# Patient Record
Sex: Female | Born: 1950
Health system: Southern US, Community
[De-identification: ages and names within clinical notes are randomized; demographics above are authoritative.]

## PROBLEM LIST (undated history)

## (undated) DIAGNOSIS — R918 Other nonspecific abnormal finding of lung field: Secondary | ICD-10-CM

## (undated) DIAGNOSIS — I10 Essential (primary) hypertension: Secondary | ICD-10-CM

## (undated) DIAGNOSIS — T7840XA Allergy, unspecified, initial encounter: Secondary | ICD-10-CM

## (undated) DIAGNOSIS — D649 Anemia, unspecified: Secondary | ICD-10-CM

## (undated) DIAGNOSIS — M1990 Unspecified osteoarthritis, unspecified site: Secondary | ICD-10-CM

## (undated) HISTORY — DX: Allergy, unspecified, initial encounter: T78.40XA

## (undated) HISTORY — PX: KNEE ARTHROSCOPY: SHX127

## (undated) HISTORY — DX: Essential (primary) hypertension: I10

## (undated) HISTORY — DX: Other nonspecific abnormal finding of lung field: R91.8

## (undated) HISTORY — DX: Unspecified osteoarthritis, unspecified site: M19.90

## (undated) HISTORY — PX: ROTATOR CUFF REPAIR: SHX139

## (undated) HISTORY — PX: BREAST BIOPSY: SHX20

## (undated) HISTORY — DX: Anemia, unspecified: D64.9

## (undated) HISTORY — PX: VAGINAL HYSTERECTOMY: SUR661

---

## 1998-02-10 ENCOUNTER — Emergency Department (HOSPITAL_COMMUNITY): Admission: EM | Admit: 1998-02-10 | Discharge: 1998-02-10 | Payer: Self-pay | Admitting: Emergency Medicine

## 1998-08-21 ENCOUNTER — Encounter: Payer: Self-pay | Admitting: Internal Medicine

## 1998-08-21 ENCOUNTER — Ambulatory Visit (HOSPITAL_COMMUNITY): Admission: RE | Admit: 1998-08-21 | Discharge: 1998-08-21 | Payer: Self-pay | Admitting: Internal Medicine

## 1998-10-16 ENCOUNTER — Ambulatory Visit (HOSPITAL_COMMUNITY): Admission: RE | Admit: 1998-10-16 | Discharge: 1998-10-16 | Payer: Self-pay | Admitting: Specialist

## 1998-10-16 ENCOUNTER — Encounter: Payer: Self-pay | Admitting: Specialist

## 1998-10-29 ENCOUNTER — Encounter: Admission: RE | Admit: 1998-10-29 | Discharge: 1998-11-23 | Payer: Self-pay | Admitting: Specialist

## 1999-10-01 ENCOUNTER — Encounter: Payer: Self-pay | Admitting: Obstetrics and Gynecology

## 1999-10-01 ENCOUNTER — Ambulatory Visit (HOSPITAL_COMMUNITY): Admission: RE | Admit: 1999-10-01 | Discharge: 1999-10-01 | Payer: Self-pay | Admitting: Obstetrics and Gynecology

## 1999-10-26 ENCOUNTER — Ambulatory Visit (HOSPITAL_COMMUNITY): Admission: RE | Admit: 1999-10-26 | Discharge: 1999-10-26 | Payer: Self-pay | Admitting: Gastroenterology

## 1999-10-26 ENCOUNTER — Encounter (INDEPENDENT_AMBULATORY_CARE_PROVIDER_SITE_OTHER): Payer: Self-pay

## 1999-11-24 ENCOUNTER — Other Ambulatory Visit: Admission: RE | Admit: 1999-11-24 | Discharge: 1999-11-24 | Payer: Self-pay | Admitting: Obstetrics and Gynecology

## 2000-04-11 ENCOUNTER — Observation Stay (HOSPITAL_COMMUNITY): Admission: RE | Admit: 2000-04-11 | Discharge: 2000-04-12 | Payer: Self-pay | Admitting: Cardiology

## 2000-05-04 ENCOUNTER — Encounter: Payer: Self-pay | Admitting: Gastroenterology

## 2000-05-04 ENCOUNTER — Ambulatory Visit (HOSPITAL_COMMUNITY): Admission: RE | Admit: 2000-05-04 | Discharge: 2000-05-04 | Payer: Self-pay | Admitting: Gastroenterology

## 2000-10-05 ENCOUNTER — Encounter: Payer: Self-pay | Admitting: Obstetrics and Gynecology

## 2000-10-05 ENCOUNTER — Ambulatory Visit (HOSPITAL_COMMUNITY): Admission: RE | Admit: 2000-10-05 | Discharge: 2000-10-05 | Payer: Self-pay | Admitting: Obstetrics and Gynecology

## 2000-12-25 ENCOUNTER — Other Ambulatory Visit: Admission: RE | Admit: 2000-12-25 | Discharge: 2000-12-25 | Payer: Self-pay | Admitting: Obstetrics and Gynecology

## 2001-04-08 ENCOUNTER — Emergency Department (HOSPITAL_COMMUNITY): Admission: EM | Admit: 2001-04-08 | Discharge: 2001-04-08 | Payer: Self-pay | Admitting: Emergency Medicine

## 2001-04-08 ENCOUNTER — Encounter: Payer: Self-pay | Admitting: Emergency Medicine

## 2001-09-04 ENCOUNTER — Encounter: Payer: Self-pay | Admitting: Internal Medicine

## 2001-09-04 ENCOUNTER — Encounter: Admission: RE | Admit: 2001-09-04 | Discharge: 2001-09-04 | Payer: Self-pay | Admitting: Internal Medicine

## 2001-09-20 ENCOUNTER — Encounter: Admission: RE | Admit: 2001-09-20 | Discharge: 2001-09-20 | Payer: Self-pay | Admitting: Internal Medicine

## 2001-09-20 ENCOUNTER — Encounter: Payer: Self-pay | Admitting: Internal Medicine

## 2001-11-05 ENCOUNTER — Ambulatory Visit (HOSPITAL_COMMUNITY): Admission: RE | Admit: 2001-11-05 | Discharge: 2001-11-05 | Payer: Self-pay | Admitting: Internal Medicine

## 2001-11-05 ENCOUNTER — Encounter: Payer: Self-pay | Admitting: Internal Medicine

## 2001-12-31 ENCOUNTER — Other Ambulatory Visit: Admission: RE | Admit: 2001-12-31 | Discharge: 2001-12-31 | Payer: Self-pay | Admitting: Obstetrics and Gynecology

## 2002-11-19 ENCOUNTER — Encounter: Payer: Self-pay | Admitting: Emergency Medicine

## 2002-11-19 ENCOUNTER — Emergency Department (HOSPITAL_COMMUNITY): Admission: EM | Admit: 2002-11-19 | Discharge: 2002-11-19 | Payer: Self-pay | Admitting: Emergency Medicine

## 2002-11-21 ENCOUNTER — Ambulatory Visit (HOSPITAL_COMMUNITY): Admission: RE | Admit: 2002-11-21 | Discharge: 2002-11-21 | Payer: Self-pay | Admitting: Internal Medicine

## 2002-11-21 ENCOUNTER — Encounter: Payer: Self-pay | Admitting: Internal Medicine

## 2003-02-05 ENCOUNTER — Encounter: Admission: RE | Admit: 2003-02-05 | Discharge: 2003-02-19 | Payer: Self-pay | Admitting: Occupational Medicine

## 2003-02-14 ENCOUNTER — Encounter: Payer: Self-pay | Admitting: Emergency Medicine

## 2003-02-14 ENCOUNTER — Emergency Department (HOSPITAL_COMMUNITY): Admission: EM | Admit: 2003-02-14 | Discharge: 2003-02-14 | Payer: Self-pay | Admitting: Emergency Medicine

## 2003-02-23 ENCOUNTER — Encounter: Payer: Self-pay | Admitting: Orthopedic Surgery

## 2003-02-23 ENCOUNTER — Encounter: Admission: RE | Admit: 2003-02-23 | Discharge: 2003-02-23 | Payer: Self-pay | Admitting: Orthopedic Surgery

## 2003-03-10 ENCOUNTER — Ambulatory Visit (HOSPITAL_BASED_OUTPATIENT_CLINIC_OR_DEPARTMENT_OTHER): Admission: RE | Admit: 2003-03-10 | Discharge: 2003-03-10 | Payer: Self-pay | Admitting: Orthopedic Surgery

## 2003-03-25 ENCOUNTER — Encounter: Admission: RE | Admit: 2003-03-25 | Discharge: 2003-05-02 | Payer: Self-pay | Admitting: Orthopedic Surgery

## 2003-05-20 ENCOUNTER — Encounter: Admission: RE | Admit: 2003-05-20 | Discharge: 2003-05-21 | Payer: Self-pay | Admitting: Orthopedic Surgery

## 2004-05-21 ENCOUNTER — Ambulatory Visit (HOSPITAL_COMMUNITY): Admission: RE | Admit: 2004-05-21 | Discharge: 2004-05-21 | Payer: Self-pay | Admitting: *Deleted

## 2004-10-18 ENCOUNTER — Ambulatory Visit (HOSPITAL_COMMUNITY): Admission: RE | Admit: 2004-10-18 | Discharge: 2004-10-18 | Payer: Self-pay | Admitting: Internal Medicine

## 2004-11-03 ENCOUNTER — Emergency Department (HOSPITAL_COMMUNITY): Admission: EM | Admit: 2004-11-03 | Discharge: 2004-11-03 | Payer: Self-pay | Admitting: Emergency Medicine

## 2005-02-24 ENCOUNTER — Encounter: Admission: RE | Admit: 2005-02-24 | Discharge: 2005-02-24 | Payer: Self-pay | Admitting: *Deleted

## 2005-03-10 ENCOUNTER — Encounter: Admission: RE | Admit: 2005-03-10 | Discharge: 2005-03-10 | Payer: Self-pay | Admitting: *Deleted

## 2005-05-13 ENCOUNTER — Observation Stay (HOSPITAL_COMMUNITY): Admission: RE | Admit: 2005-05-13 | Discharge: 2005-05-14 | Payer: Self-pay | Admitting: Surgery

## 2005-05-13 ENCOUNTER — Encounter (INDEPENDENT_AMBULATORY_CARE_PROVIDER_SITE_OTHER): Payer: Self-pay | Admitting: Specialist

## 2005-08-07 ENCOUNTER — Emergency Department (HOSPITAL_COMMUNITY): Admission: EM | Admit: 2005-08-07 | Discharge: 2005-08-07 | Payer: Self-pay | Admitting: Family Medicine

## 2005-08-08 ENCOUNTER — Emergency Department (HOSPITAL_COMMUNITY): Admission: EM | Admit: 2005-08-08 | Discharge: 2005-08-08 | Payer: Self-pay | Admitting: Emergency Medicine

## 2005-09-12 HISTORY — PX: COLONOSCOPY: SHX174

## 2005-12-01 ENCOUNTER — Ambulatory Visit (HOSPITAL_COMMUNITY): Admission: RE | Admit: 2005-12-01 | Discharge: 2005-12-01 | Payer: Self-pay | Admitting: Internal Medicine

## 2007-01-12 ENCOUNTER — Ambulatory Visit (HOSPITAL_COMMUNITY): Admission: RE | Admit: 2007-01-12 | Discharge: 2007-01-12 | Payer: Self-pay | Admitting: Specialist

## 2007-01-19 ENCOUNTER — Ambulatory Visit (HOSPITAL_COMMUNITY): Admission: RE | Admit: 2007-01-19 | Discharge: 2007-01-19 | Payer: Self-pay | Admitting: Specialist

## 2007-05-11 ENCOUNTER — Ambulatory Visit (HOSPITAL_COMMUNITY): Admission: RE | Admit: 2007-05-11 | Discharge: 2007-05-11 | Payer: Self-pay | Admitting: Specialist

## 2007-08-01 ENCOUNTER — Ambulatory Visit (HOSPITAL_COMMUNITY): Admission: RE | Admit: 2007-08-01 | Discharge: 2007-08-02 | Payer: Self-pay | Admitting: Specialist

## 2007-08-21 ENCOUNTER — Encounter: Admission: RE | Admit: 2007-08-21 | Discharge: 2007-08-22 | Payer: Self-pay | Admitting: Specialist

## 2007-09-17 ENCOUNTER — Encounter: Admission: RE | Admit: 2007-09-17 | Discharge: 2007-12-16 | Payer: Self-pay | Admitting: Specialist

## 2007-09-25 ENCOUNTER — Ambulatory Visit (HOSPITAL_COMMUNITY): Admission: RE | Admit: 2007-09-25 | Discharge: 2007-09-25 | Payer: Self-pay | Admitting: Specialist

## 2007-09-26 ENCOUNTER — Encounter: Admission: RE | Admit: 2007-09-26 | Discharge: 2007-09-26 | Payer: Self-pay | Admitting: Specialist

## 2007-10-03 ENCOUNTER — Ambulatory Visit: Payer: Self-pay | Admitting: Internal Medicine

## 2007-10-03 DIAGNOSIS — J984 Other disorders of lung: Secondary | ICD-10-CM | POA: Insufficient documentation

## 2007-10-03 LAB — CONVERTED CEMR LAB: Sed Rate: 59 mm/hr — ABNORMAL HIGH (ref 0–25)

## 2007-12-17 ENCOUNTER — Encounter: Admission: RE | Admit: 2007-12-17 | Discharge: 2008-01-22 | Payer: Self-pay | Admitting: Specialist

## 2008-02-15 ENCOUNTER — Ambulatory Visit: Payer: Self-pay | Admitting: Internal Medicine

## 2008-02-19 LAB — CONVERTED CEMR LAB: Sed Rate: 46 mm/hr — ABNORMAL HIGH (ref 0–22)

## 2008-03-05 ENCOUNTER — Ambulatory Visit (HOSPITAL_COMMUNITY): Admission: RE | Admit: 2008-03-05 | Discharge: 2008-03-05 | Payer: Self-pay | Admitting: Internal Medicine

## 2008-07-13 ENCOUNTER — Emergency Department (HOSPITAL_COMMUNITY): Admission: EM | Admit: 2008-07-13 | Discharge: 2008-07-13 | Payer: Self-pay | Admitting: Emergency Medicine

## 2008-08-21 ENCOUNTER — Encounter: Admission: RE | Admit: 2008-08-21 | Discharge: 2008-08-21 | Payer: Self-pay | Admitting: Specialist

## 2008-08-25 ENCOUNTER — Ambulatory Visit: Payer: Self-pay | Admitting: Internal Medicine

## 2008-08-26 LAB — CONVERTED CEMR LAB: Sed Rate: 57 mm/h — ABNORMAL HIGH

## 2008-09-15 ENCOUNTER — Encounter: Admission: RE | Admit: 2008-09-15 | Discharge: 2008-09-15 | Payer: Self-pay | Admitting: Specialist

## 2009-01-21 ENCOUNTER — Ambulatory Visit (HOSPITAL_COMMUNITY): Admission: RE | Admit: 2009-01-21 | Discharge: 2009-01-21 | Payer: Self-pay | Admitting: *Deleted

## 2009-02-16 ENCOUNTER — Telehealth (INDEPENDENT_AMBULATORY_CARE_PROVIDER_SITE_OTHER): Payer: Self-pay | Admitting: *Deleted

## 2009-03-06 ENCOUNTER — Ambulatory Visit: Payer: Self-pay | Admitting: Internal Medicine

## 2009-08-28 ENCOUNTER — Ambulatory Visit: Payer: Self-pay | Admitting: Internal Medicine

## 2009-09-02 ENCOUNTER — Telehealth (INDEPENDENT_AMBULATORY_CARE_PROVIDER_SITE_OTHER): Payer: Self-pay | Admitting: *Deleted

## 2009-09-15 LAB — CBC & DIFF AND RETIC
BASO%: 0.4 % (ref 0.0–2.0)
Basophils Absolute: 0 10*3/uL (ref 0.0–0.1)
EOS%: 0.5 % (ref 0.0–7.0)
Eosinophils Absolute: 0 10*3/uL (ref 0.0–0.5)
HCT: 38.1 % (ref 34.8–46.6)
HGB: 12.1 g/dL (ref 11.6–15.9)
Immature Retic Fract: 7.7 % (ref 0.00–10.70)
LYMPH%: 48.4 % (ref 14.0–49.7)
MCH: 26.8 pg (ref 25.1–34.0)
MCHC: 31.8 g/dL (ref 31.5–36.0)
MCV: 84.5 fL (ref 79.5–101.0)
MONO#: 0.4 10*3/uL (ref 0.1–0.9)
MONO%: 5.7 % (ref 0.0–14.0)
NEUT#: 3.4 10*3/uL (ref 1.5–6.5)
NEUT%: 45 % (ref 38.4–76.8)
Platelets: 329 10*3/uL (ref 145–400)
RBC: 4.51 10*6/uL (ref 3.70–5.45)
RDW: 13.4 % (ref 11.2–14.5)
Retic %: 1.94 % — ABNORMAL HIGH (ref 0.50–1.50)
Retic Ct Abs: 87.49 10*3/uL — ABNORMAL HIGH (ref 18.30–72.70)
WBC: 7.6 10*3/uL (ref 3.9–10.3)
lymph#: 3.7 10*3/uL — ABNORMAL HIGH (ref 0.9–3.3)

## 2009-09-17 LAB — COMPREHENSIVE METABOLIC PANEL
ALT: 19 U/L (ref 0–35)
AST: 14 U/L (ref 0–37)
Albumin: 4.2 g/dL (ref 3.5–5.2)
Alkaline Phosphatase: 76 U/L (ref 39–117)
BUN: 18 mg/dL (ref 6–23)
CO2: 24 mEq/L (ref 19–32)
Calcium: 9.3 mg/dL (ref 8.4–10.5)
Chloride: 102 mEq/L (ref 96–112)
Creatinine, Ser: 0.93 mg/dL (ref 0.40–1.20)
Glucose, Bld: 102 mg/dL — ABNORMAL HIGH (ref 70–99)
Potassium: 3.8 mEq/L (ref 3.5–5.3)
Sodium: 141 mEq/L (ref 135–145)
Total Bilirubin: 0.4 mg/dL (ref 0.3–1.2)
Total Protein: 7.4 g/dL (ref 6.0–8.3)

## 2009-09-17 LAB — PROTEIN ELECTROPHORESIS, SERUM
Albumin ELP: 51.4 % — ABNORMAL LOW (ref 55.8–66.1)
Alpha-1-Globulin: 4.2 % (ref 2.9–4.9)
Alpha-2-Globulin: 13.9 % — ABNORMAL HIGH (ref 7.1–11.8)
Beta 2: 5.6 % (ref 3.2–6.5)
Beta Globulin: 6.2 % (ref 4.7–7.2)
Gamma Globulin: 18.7 % (ref 11.1–18.8)
Total Protein, Serum Electrophoresis: 7.4 g/dL (ref 6.0–8.3)

## 2009-09-17 LAB — ERYTHROPOIETIN: Erythropoietin: 15.2 m[IU]/mL (ref 2.6–34.0)

## 2009-09-17 LAB — VITAMIN B12: Vitamin B-12: 1497 pg/mL — ABNORMAL HIGH (ref 211–911)

## 2009-09-17 LAB — IRON AND TIBC
%SAT: 17 % — ABNORMAL LOW (ref 20–55)
Iron: 50 ug/dL (ref 42–145)
TIBC: 291 ug/dL (ref 250–470)
UIBC: 241 ug/dL

## 2009-09-17 LAB — FERRITIN: Ferritin: 485 ng/mL — ABNORMAL HIGH (ref 10–291)

## 2009-09-17 LAB — TSH: TSH: 1.987 u[IU]/mL (ref 0.350–4.500)

## 2009-09-17 LAB — LACTATE DEHYDROGENASE: LDH: 156 U/L (ref 94–250)

## 2009-09-17 LAB — FOLATE: Folate: 7.1 ng/mL

## 2009-09-25 ENCOUNTER — Ambulatory Visit: Payer: Self-pay | Admitting: Internal Medicine

## 2009-09-29 LAB — CBC WITH DIFFERENTIAL/PLATELET
BASO%: 0.7 % (ref 0.0–2.0)
Basophils Absolute: 0.1 10*3/uL (ref 0.0–0.1)
EOS%: 0.6 % (ref 0.0–7.0)
Eosinophils Absolute: 0.1 10*3/uL (ref 0.0–0.5)
HCT: 36.8 % (ref 34.8–46.6)
HGB: 12 g/dL (ref 11.6–15.9)
LYMPH%: 42.6 % (ref 14.0–49.7)
MCH: 27.6 pg (ref 25.1–34.0)
MCHC: 32.6 g/dL (ref 31.5–36.0)
MCV: 84.7 fL (ref 79.5–101.0)
MONO#: 0.6 10*3/uL (ref 0.1–0.9)
MONO%: 6.9 % (ref 0.0–14.0)
NEUT#: 4.4 10*3/uL (ref 1.5–6.5)
NEUT%: 49.2 % (ref 38.4–76.8)
Platelets: 402 10*3/uL — ABNORMAL HIGH (ref 145–400)
RBC: 4.35 10*6/uL (ref 3.70–5.45)
RDW: 13.5 % (ref 11.2–14.5)
WBC: 8.9 10*3/uL (ref 3.9–10.3)
lymph#: 3.8 10*3/uL — ABNORMAL HIGH (ref 0.9–3.3)

## 2009-11-12 ENCOUNTER — Encounter: Payer: Self-pay | Admitting: Internal Medicine

## 2010-02-12 ENCOUNTER — Ambulatory Visit (HOSPITAL_COMMUNITY): Admission: RE | Admit: 2010-02-12 | Discharge: 2010-02-12 | Payer: Self-pay | Admitting: Internal Medicine

## 2010-03-08 ENCOUNTER — Ambulatory Visit: Payer: Self-pay | Admitting: Internal Medicine

## 2010-03-08 DIAGNOSIS — R05 Cough: Secondary | ICD-10-CM

## 2010-03-08 DIAGNOSIS — I10 Essential (primary) hypertension: Secondary | ICD-10-CM | POA: Insufficient documentation

## 2010-03-08 DIAGNOSIS — R059 Cough, unspecified: Secondary | ICD-10-CM | POA: Insufficient documentation

## 2010-03-09 ENCOUNTER — Telehealth (INDEPENDENT_AMBULATORY_CARE_PROVIDER_SITE_OTHER): Payer: Self-pay | Admitting: *Deleted

## 2010-09-12 HISTORY — PX: COLONOSCOPY: SHX174

## 2010-10-03 ENCOUNTER — Encounter: Payer: Self-pay | Admitting: Obstetrics and Gynecology

## 2010-10-14 NOTE — Progress Notes (Signed)
----   Converted from flag ---- ---- 08/25/2008 6:00 PM, Nyoka Cowden MD wrote: be sure has fu cxr by now ------------------------------  ov sched for pt to followup with cxr on 03/06/09 at 11:30 am.

## 2010-10-14 NOTE — Assessment & Plan Note (Signed)
Summary: pulm nodules/apc   Visit Type:  Initial Consult PCP:  Merri Brunette  Chief Complaint:  Pt. here for pulmonary consult to evaluate pulmonary nodules.  Referred by Dr. Renne Crigler.  History of Present Illness: 60 year old black female never smoker with unexplained right arm swelling following rotator cuff surgery in November of 2008.  Her workup included a venous Doppler of the right upper extremity which was normal and a CT scan of the chest showing multiple pulmonary nodules.  The patient is completely astigmatic in terms of her pulmonary complaints.  She denies any significant cough chest pain fevers chills sweats unintended weight loss myalgias or arthralgias or dyspnea.  She has never been diagnosed with any kind of rheumatism or cancer to her knowledge.   Current Allergies: No known allergies   Past Medical History:    hypertension    moderate obesity  Past Surgical History:    rotator cuff right 08/01/2007    remote hysterectomy benign etiology per patient report   Family History:    Breast Cancer 2x sister    melanoma brother  Social History:    Patient never smoked.    Risk Factors:  Tobacco use:  never   Review of Systems  The patient denies anorexia, fever, weight loss, vision loss, decreased hearing, hoarseness, chest pain, syncope, dyspnea on exhertion, peripheral edema, prolonged cough, hemoptysis, abdominal pain, melena, hematochezia, severe indigestion/heartburn, hematuria, incontinence, genital sores, muscle weakness, suspicious skin lesions, transient blindness, difficulty walking, depression, unusual weight change, abnormal bleeding, enlarged lymph nodes, angioedema, and breast masses.         She has not had a recent mammogram but notices no breast masses by self exam   Vital Signs:  Patient Profile:   61 Years Old Female Weight:      194 pounds O2 Sat:      98 % O2 treatment:    Room Air Temp:     99.2 degrees F oral Pulse rate:   97 /  minute BP sitting:   120 / 74  (left arm) Cuff size:   large  Vitals Entered By: Vernie Murders (October 03, 2007 1:55 PM)                 Physical Exam  healthy-appearing moderately obese black female in no acute distress with obvious right hand swelling.  Ambulatory healthy appearing in no acute distress. Afeb with normal vital signs HEENT: nl dentition, turbinates, and orophanx. Nl external ear canals without cough reflex Neck without JVD/Nodes/TM Lungs clear to A and P bilaterally without cough on insp or exp maneuvers RRR no s3 or murmur or increase in P2 Abd soft and benign with nl excursion in the supine position. No bruits or organomegaly Ext warm without calf tenderness, cyanosis clubbing or edema except for being in the right hand Skin warm and dry without lesions       Impression & Recommendations:  Problem # 1:  PULMONARY NODULE (ICD-518.89)  Her sed rate is 59.  This is not inconsistent with recent surgery but is a bit worrisome of the nodules represent more than benign scar tissue from previous granulomatous or other type of pulmonary insult.  The differential diagnosis does include metastatic cancer, MI I, and occult collagen vascular disease. I don't see any association between this and her right arm swelling especially in the absence of peripheral or central venous thrombosis but I suspect she has poor lymphatic drainage of the right arm explaining the problem and in the  absence of bulky adenopathy don't believe I can put these two problems together.  I spent a long time discussing the differential diagnosis with the patient emphasizing that the only way to be sure about this would be to do an open lung biopsy, which I believe would be a bit excessive at this point and the workup.  I would therefore sent to recommend a follow-up chest x-ray and sed rate in 3 months absent new pulmonary complaints that I went over with her very carefully.   Medications Added to  Medication List This Visit: 1)  Lisinopril-hydrochlorothiazide 20-12.5 Mg Tabs (Lisinopril-hydrochlorothiazide) .... Every morning 2)  Aciphex 20 Mg Tbec (Rabeprazole sodium) .... Once daily 3)  Norco 10-325 Mg Tabs (Hydrocodone-acetaminophen) .Marland Kitchen.. 1 tablet every 4 hours as needed 4)  Maxalt-mlt 10 Mg Tbdp (Rizatriptan benzoate) .... As needed for head aches   Patient Instructions: 1)  Please schedule a follow-up appointment in 3 months with a chest xray, sooner if coughing, pain with breathing, unintended wt loss, short of breath or unexplained fevers and nightsweats. 2)  Get your mammogram done as soon as possible and have them fax [place] a copy.    ]   Appended Document: pulm nodules/apc Copy will be sent to Dr. Renne Crigler.

## 2010-10-14 NOTE — Assessment & Plan Note (Signed)
Summary: fu////kp   Visit Type:  Follow-up PCP:  Merri Brunette  Chief Complaint:  Followup with cxr today.  She states her breathing is well.  She has no complaints today.Marland Kitchen  History of Present Illness: no symptoms, here for fu cxrr  60 year old black female never smoker with unexplained right arm swelling following rotator cuff surgery in November of 2008.  Her workup included a venous Doppler of the right upper extremity which was normal and a CT scan of the chest showing multiple pulmonary nodules.  The patient is completely asymptomatic in terms of her pulmonary complaints.  She denies any significant cough chest pain fevers chills sweats unintended weight loss myalgias or arthralgias or dyspnea.  She has never been diagnosed with any kind of rheumatism or cancer to her knowledge.  June  5  Meds:  LISINOPRIL-HYDROCHLOROTHIAZIDE 20-12.5 MG  TABS (LISINOPRIL-HYDROCHLOROTHIAZIDE) every morning ACIPHEX 20 MG  TBEC (RABEPRAZOLE SODIUM) once daily NORCO 10-325 MG  TABS (HYDROCODONE-ACETAMINOPHEN) 1 tablet every 4 hours as needed MAXALT-MLT 10 MG  TBDP (RIZATRIPTAN BENZOATE) as needed for head aches LYRICA 150 MG  CAPS (PREGABALIN) two times a day AMITRIPTYLINE HCL 25 MG  TABS (AMITRIPTYLINE HCL) at bedtime DETROL LA 4 MG  CP24 (TOLTERODINE TARTRATE) once daily     Current Allergies: No known allergies   Past Medical History:    Reviewed history from 10/03/2007 and no changes required:       hypertension       moderate obesity   Family History:    Reviewed history from 10/03/2007 and no changes required:       Breast Cancer 2x sister       melanoma brother  Social History:    Reviewed history from 10/03/2007 and no changes required:       Patient never smoked.      Vital Signs:  Patient Profile:   60 Years Old Female Weight:      202.38 pounds O2 Sat:      96 % O2 treatment:    Room Air Temp:     98.9 degrees F oral Pulse rate:   91 / minute BP sitting:   134 / 82   (left arm) Cuff size:   large  Vitals Entered By: Vernie Murders (February 15, 2008 9:36 AM)                 Physical Exam     Ambulatory healthy appearing in no acute distress. Afeb with normal vital signs HEENT: nl dentition, turbinates, and orophanx. Nl external ear canals without cough reflex Neck without JVD/Nodes/TM Lungs clear to A and P bilaterally without cough on insp or exp maneuvers RRR no s3 or murmur or increase in P2 Abd soft and benign with nl excursion in the supine position. No bruits or organomegaly Ext warm without calf tenderness, cyanosis clubbing or edema except for being in the right hand Skin warm and dry without lesions     CXR  Procedure date:  02/15/2008  Findings:      normal    Impression & Recommendations:  Problem # 1:  PULMONARY NODULE (ICD-518.89) ESR trending down (59-> 46)c/w inflammation from her shoulder surgery, not a systemic problem related to the multiple nodules.  Not visible  on plain xray at baseline nor today, no symptoms so conservative fu most appropriate  here.   Orders: T-2 View CXR, Same Day (71020.5TC) TLB-Sedimentation Rate (ESR) (85651-ESR) Est. Patient Level III (78295)   Medications Added to Medication  List This Visit: 1)  Lyrica 150 Mg Caps (Pregabalin) .... Two times a day 2)  Amitriptyline Hcl 25 Mg Tabs (Amitriptyline hcl) .... At bedtime 3)  Detrol La 4 Mg Cp24 (Tolterodine tartrate) .... Once daily   Patient Instructions: 1)  be sure to schedule your mammograms 2)  Please schedule a follow-up appointment in 6 months with cxr, sooner if needed for cough, weight loss, chest pain when  breath or unusual short of breath   ]

## 2010-10-14 NOTE — Assessment & Plan Note (Signed)
Summary: Pulmonary/ fu mpn   PCP:  Merri Brunette  Chief Complaint:  6 month followup with cxr.  Pt denies any complaints today.Marland Kitchen  History of Present Illness:   60 year-old black female never smoker with unexplained right arm swelling following rotator cuff surgery in November of 2008.  Her workup included a venous Doppler of the right upper extremity which was normal and a CT scan of the chest showing multiple pulmonary nodules with ESR 59    02/15/08 ov with cxr without macroscopic nodules   August 25, 2008 ov denies cp, cough, sob, unintended wt loss, says right arm improving.     Updated Prior Medication List: LISINOPRIL-HYDROCHLOROTHIAZIDE 20-12.5 MG  TABS (LISINOPRIL-HYDROCHLOROTHIAZIDE) every morning ACIPHEX 20 MG  TBEC (RABEPRAZOLE SODIUM) once daily NORCO 10-325 MG  TABS (HYDROCODONE-ACETAMINOPHEN) 1 tablet every 4 hours as needed MAXALT-MLT 10 MG  TBDP (RIZATRIPTAN BENZOATE) as needed for head aches DETROL LA 4 MG  CP24 (TOLTERODINE TARTRATE) once daily  Current Allergies (reviewed today): No known allergies   Past Medical History:    hypertension    moderate obesity    MULTIPLE PULMONARY NODULES         - Original dx Incidental CT 09/25/07 with ESR 59         - August 25, 2008 fu cxr nl, ESR 57          Family History:    Reviewed history from 10/03/2007 and no changes required:       Breast Cancer 2x sister       melanoma brother  Social History:    Reviewed history from 10/03/2007 and no changes required:       Patient never smoked.      Vital Signs:  Patient Profile:   60 Years Old Female Weight:      199 pounds O2 Sat:      97 % O2 treatment:    Room Air Temp:     98.4 degrees F oral Pulse rate:   83 / minute BP sitting:   130 / 90  (left arm)  Vitals Entered By: Vernie Murders (August 25, 2008 11:08 AM)                 Physical Exam  Ambulatory healthy appearing in no acute distress. Afeb with normal vital signs  wt 202 > 199  August 25, 2008  HEENT: nl dentition, turbinates, and orophanx. Nl external ear canals without cough reflex Neck without JVD/Nodes/TM Lungs clear to A and P bilaterally without cough on insp or exp maneuvers RRR no s3 or murmur or increase in P2 Abd soft and benign with nl excursion in the supine position. No bruits or organomegaly Ext warm without calf tenderness, cyanosis clubbing or edema except for being in the right hand Skin warm and dry without lesions        Impression & Recommendations:  Problem # 1:  PULMONARY NODULE (ICD-518.89) ESR still high but she looks great could have low grade mycobacterial infection (MAI) but most likely does not have underlying ca or collagen vasc dz  Rec fu cxr q 6 mo x 1 year then yearly thereafter  Orders: TLB-Sedimentation Rate (ESR) (85651-ESR) T-2 View CXR, Same Day (71020.5TC) Est. Patient Level III (16109)   Complete Medication List: 1)  Lisinopril-hydrochlorothiazide 20-12.5 Mg Tabs (Lisinopril-hydrochlorothiazide) .... Every morning 2)  Aciphex 20 Mg Tbec (Rabeprazole sodium) .... Once daily 3)  Norco 10-325 Mg Tabs (Hydrocodone-acetaminophen) .Marland Kitchen.. 1 tablet every 4 hours as  needed 4)  Maxalt-mlt 10 Mg Tbdp (Rizatriptan benzoate) .... As needed for head aches 5)  Detrol La 4 Mg Cp24 (Tolterodine tartrate) .... Once daily   Patient Instructions: 1)  We will call you with lab results   ]

## 2010-10-14 NOTE — Assessment & Plan Note (Signed)
Summary: Pulmonary/ ext f/u ov for mpn/ cough > try off ACe   Primary Provider/Referring Provider:  Merri Brunette  CC:  Annual followup.  Pt c/o dry cough that wakes her up at night- onset "a long time ago".  Denies any SOB or other complaints..  History of Present Illness: 68 yobf  never smoker with unexplained right arm swelling following rotator cuff surgery in November of 2008.  Her workup included a venous Doppler of the right upper extremity which was normal and a CT scan of the chest showing multiple pulmonary nodules with ESR 59.  02/15/08 ov with cxr without macroscopic nodules.   March 06, 2009 ov no sob, no cough, main problem is back pain and seeing Dareen Piano now for "rheumatism"  therefore felt to have low grade RA lung dz and rec f/u in one year, sooner if symptoms.  March 08, 2010 Annual followup.  Pt c/o dry cough  on ACE that wakes her up at night- onset "a long time ago" x 6-7 months but not sob or cp.  feels like "drainage" in throat but no excess mucus production.  Pt denies any significant sore throat, dysphagia, itching, sneezing,  nasal congestion or excess secretions,  fever, chills, sweats, unintended wt loss, pleuritic or exertional cp, hempoptysis, change in activity tolerance  orthopnea pnd or leg swelling Pt also denies any obvious fluctuation in symptoms with weather or environmental change or other alleviating or aggravating factors.       Current Medications (verified): 1)  Lisinopril-Hydrochlorothiazide 20-12.5 Mg  Tabs (Lisinopril-Hydrochlorothiazide) .... Every Morning 2)  Maxalt-Mlt 10 Mg  Tbdp (Rizatriptan Benzoate) .... As Needed For Head Aches 3)  Detrol La 4 Mg  Cp24 (Tolterodine Tartrate) .... Once Daily 4)  Ranitidine Hcl 150 Mg Tabs (Ranitidine Hcl) .Marland Kitchen.. 1 Two Times A Day 5)  Ferrous Sulfate 325 (65 Fe) Mg Tabs (Ferrous Sulfate) .Marland Kitchen.. 1 Once Daily  Allergies (verified): No Known Drug Allergies  Past History:  Past Medical History: hypertension   -  Try off ACE March 08, 2010 (cough) moderate obesity MULTIPLE PULMONARY NODULES      - Original dx Incidental CT 09/25/07 with ESR 59      - August 25, 2008 fu cxr nl, ESR 57      - March 06, 2009 cxr no nodules  > repeat March 08, 2010 no change      Vital Signs:  Patient profile:   60 year old female Weight:      188 pounds BMI:     33.42 O2 Sat:      98 % on Room air Temp:     97.8 degrees F oral Pulse rate:   74 / minute BP sitting:   100 / 64  (left arm)  Vitals Entered By: Vernie Murders (March 08, 2010 9:20 AM)  O2 Flow:  Room air  Physical Exam  Additional Exam:  Ambulatory healthy appearing bf  in no acute distress.  wt  199 August 25, 2008  > 192 March 06, 2009 > 188 March 08, 2010  HEENT: nl dentition, turbinates, and orophanx. Nl external ear canals without cough reflex Neck without JVD/Nodes/TM Lungs clear to A and P bilaterally without cough on insp or exp maneuvers RRR no s3 or murmur or increase in P2 Abd soft and benign with nl excursion in the supine position. No bruits or organomegaly Ext warm without calf tenderness, cyanosis clubbing or edema except for being in the right hand Skin warm and dry  without lesions     CXR  Procedure date:  03/08/2010  Findings:      Comparison: 03/06/2009 and CT chest 09/25/2007   Findings: Trachea is midline.  Heart is at the upper limits of normal in size, to mildly enlarged, stable.  There may be minimal scarring in the right middle lobe and lingula.  No pleural fluid. Mild degenerative changes in the spine.   IMPRESSION: No acute findings.  Impression & Recommendations:  Problem # 1:  PULMONARY NODULE (ICD-518.89) Muliple pulmonary nodules < 8 mm therefore below the radar screen for PET,  minimally invasive bx or f/u on cxr for that matter, and old xrays won't do any good here.  Most likely they are benign; however, given the mulitple sites noted, there is no early option for surgical cure even if one of them  turned out to be malignant.  F/u cxr yearly at this point is appropriate since nl cxr x 2 years  Problem # 2:  COUGH (ICD-786.2)  c/w  Classic Upper airway cough syndrome, so named because it's frequently impossible to sort out how much is  CR/sinusitis with freq throat clearing (which can be related to primary GERD)   vs  causing  secondary extra esophageal GERD from wide swings in gastric pressure that occur with throat clearing, promoting self use of mint and menthol lozenges that reduce the lower esophageal sphincter tone and exacerbate the problem further These are the same pts who not infrequently have failed to tolerate ace inhibitors,  dry powder inhalers or biphosphonates or report having reflux symptoms that don't respond to standard doses of PPI  Try off ace and change zantac to hs dose  Orders: Est. Patient Level IV (53664)  Problem # 3:  HYPERTENSION, BENIGN (ICD-401.1)  The following medications were removed from the medication list:    Lisinopril-hydrochlorothiazide 20-12.5 Mg Tabs (Lisinopril-hydrochlorothiazide) ..... Every morning Her updated medication list for this problem includes:    Avalide 150-12.5 Mg Tabs (Irbesartan-hydrochlorothiazide) ..... One daily    ACE inhibitors are problematic in  pts with airway complaints because  even experienced pulmonologists can't always distinguish ace effects from copd/asthma.  By themselves they don't actually cause a problem, much like oxygen can't by itself start a fire, but they certainly serve as a powerful catalyst or enhancer for any "fire"  or inflammatory process in the upper airway, be it caused by an ET  tube or more commonly reflux (especially in the obese or pts with known GERD or who are on biphoshonates).  In the era of ARB near equivalency until we have a better handle on the reversibility of the airway problem, it just makes sense to avoid ace entirely in the short run and then decide later, having established a level of  airway control using a reasonable limited regimen, whether to add back ace but even then being very careful to observe the pt for worsening airway control and number of meds used/ needed to control symptoms.    Orders: Est. Patient Level IV (40347)  Medications Added to Medication List This Visit: 1)  Ranitidine Hcl 150 Mg Tabs (Ranitidine hcl) .... One in am and second dose at bedtime 2)  Ferrous Sulfate 325 (65 Fe) Mg Tabs (Ferrous sulfate) .Marland Kitchen.. 1 once daily 3)  Avalide 150-12.5 Mg Tabs (Irbesartan-hydrochlorothiazide) .... One daily  Other Orders: T-2 View CXR (71020TC)  Patient Instructions: 1)  Return in one year for cxr, sooner if cough or shortness or breath 2)  Stop  lisinopril and see me back in 6 weeks if cough is not gone to your satisfaction 3)  Start Avalide samples 15012.5 one daily  break in half if too strong + light headed standing 4)  Copy sent to: 5)  [facility] (rheum)

## 2010-10-14 NOTE — Progress Notes (Signed)
Summary: Record request  Record request from [facility]. Request forwarded to [employer]. Dena Chavis  September 02, 2009 4:56 PM  Appended Document: Record request Record request from [facility]. Request forwarded to [employer].

## 2010-10-14 NOTE — Assessment & Plan Note (Signed)
Summary: Pulmonary/ f/u pulmonary nodules   Primary Provider/Referring Provider:  Merri Brunette  CC:  6 month followup with cxr.  No complaints today. Breathing fine per pt..  History of Present Illness: 60 year-old black female never smoker with unexplained right arm swelling following rotator cuff surgery in November of 2008.  Her workup included a venous Doppler of the right upper extremity which was normal and a CT scan of the chest showing multiple pulmonary nodules with ESR 59  02/15/08 ov with cxr without macroscopic nodules   August 25, 2008 ov denies cp, cough, sob, unintended wt loss, says right arm improving.  March 06, 2009 ov no sob, no cough, main problem is back pain and seeing [facility] now for "rheumatism" .  Pt denies any significant sore throat, nasal congestion or excess secretions, fever, chills, sweats, unintended wt loss, pleuritic or exertional cp, orthopnea pnd or leg swelling.  Pt also denies any obvious fluctuation in symptoms with weather or environmental change or other alleviating or aggravating factors.       Current Medications (verified): 1)  Lisinopril-Hydrochlorothiazide 20-12.5 Mg  Tabs (Lisinopril-Hydrochlorothiazide) .... Every Morning 2)  Maxalt-Mlt 10 Mg  Tbdp (Rizatriptan Benzoate) .... As Needed For Head Aches 3)  Detrol La 4 Mg  Cp24 (Tolterodine Tartrate) .... Once Daily 4)  Ranitidine Hcl 150 Mg Tabs (Ranitidine Hcl) .Marland Kitchen.. 1 Two Times A Day 5)  Naproxen Sodium 220 Mg Tabs (Naproxen Sodium) .Marland Kitchen.. 1 Two Times A Day As Needed  Allergies (verified): No Known Drug Allergies  Past History:  Past Medical History: hypertension moderate obesity MULTIPLE PULMONARY NODULES      - Original dx Incidental CT 09/25/07 with ESR 59      - August 25, 2008 fu cxr nl, ESR 57      - March 06, 2009 cxr no nodules        Vital Signs:  Patient profile:   60 year old female Height:      63 inches Weight:      192 pounds BMI:     34.13 O2 Sat:      100 % on  Room air Temp:     98.2 degrees F oral Pulse rate:   70 / minute BP sitting:   124 / 70  (left arm) Cuff size:   large  Vitals Entered By: Vernie Murders (March 06, 2009 11:47 AM)  O2 Flow:  Room air  Physical Exam  Additional Exam:  Ambulatory healthy appearing in no acute distress. Afeb with normal vital signs   wt  199 August 25, 2008  > 192 March 06, 2009  HEENT: nl dentition, turbinates, and orophanx. Nl external ear canals without cough reflex Neck without JVD/Nodes/TM Lungs clear to A and P bilaterally without cough on insp or exp maneuvers RRR no s3 or murmur or increase in P2 Abd soft and benign with nl excursion in the supine position. No bruits or organomegaly Ext warm without calf tenderness, cyanosis clubbing or edema except for being in the right hand Skin warm and dry without lesions     CXR  Procedure date:  03/06/2009  Findings:      No acute finding.  Tiny pulmonary nodules seen on chest CT are not visible on plain film.  Impression & Recommendations:  Problem # 1:  PULMONARY NODULE (ICD-518.89)  Mulitple pulmonary nodules not seen on plain cxr are either benign/inflammatory or early metatstatic dz. Since they're been followed conservatively for 1.5  years now without "marcroscopic"  evolution they are likely benign   If indeed she has RA then they are probably related to that but do not per se change her rx in the "microscopic" range. Since Dr Dareen Piano is following her also pulmonary can be done with yearly cxr unless done anyway for either primary care or rheumatologic purposes.  See instructions for specific recommendations   Orders: Est. Patient Level III (16109)  Medications Added to Medication List This Visit: 1)  Ranitidine Hcl 150 Mg Tabs (Ranitidine hcl) .Marland Kitchen.. 1 two times a day 2)  Naproxen Sodium 220 Mg Tabs (Naproxen sodium) .Marland Kitchen.. 1 two times a day as needed  Patient Instructions: 1)  Return in one year for cxr, sooner if cough or shortness  or breath  Appended Document: Pulmonary/ f/u pulmonary nodules copy to [employer] and [employer] and [employer]

## 2010-10-14 NOTE — Progress Notes (Signed)
  Phone Note Other Incoming   Request: Send information Summary of Call: Request for records received from [facility]. Request forwarded to [employer].

## 2010-11-09 ENCOUNTER — Ambulatory Visit: Payer: Self-pay | Admitting: Internal Medicine

## 2010-12-01 ENCOUNTER — Encounter: Payer: Self-pay | Admitting: Internal Medicine

## 2010-12-06 ENCOUNTER — Ambulatory Visit: Payer: Self-pay | Admitting: Internal Medicine

## 2010-12-21 ENCOUNTER — Encounter: Payer: Self-pay | Admitting: Pulmonary Disease

## 2010-12-23 ENCOUNTER — Ambulatory Visit: Payer: Self-pay | Admitting: Internal Medicine

## 2011-01-25 NOTE — Op Note (Signed)
NAMETEKEYA, GEFFERT       ACCOUNT NO.:  [id number]   MEDICAL RECORD NO.:  [id number]          PATIENT TYPE:  AMB   LOCATION:  ENDO                         FACILITY:  [facility]   PHYSICIAN:  Georgiana Spinner, M.D.    DATE OF BIRTH:  1951-01-05   DATE OF PROCEDURE:  01/21/2009  DATE OF DISCHARGE:                               OPERATIVE REPORT   PROCEDURE:  Colonoscopy.   INDICATIONS:  Colon cancer screening.  Previous history of colon polyps.   ANESTHESIA:  Fentanyl 25 mcg, Versed 2 mg.   PROCEDURE:  With the patient mildly sedated in the left lateral  decubitus position, the Pentax videoscopic colonoscope was inserted in  the rectum and passed under direct vision with pressure applied.  We  reached the cecum identified by base of cecum and ileocecal valve, both  of which were photographed.  From this point the colonoscope was slowly  withdrawn taking circumferential views of colonic mucosa stopping in the  rectum which appeared normal on direct, showed hemorrhoids on  retroflexed view.  The endoscope was straightened and withdrawn.  The  patient's vital signs, pulse oximeter remained stable.  The patient  tolerated procedure well without apparent complications.   FINDINGS:  Internal hemorrhoids, otherwise an unremarkable exam.   PLAN:  Have patient follow-up with me as an outpatient to determine if  there is any further workup necessary for abdominal pain.           ______________________________  Georgiana Spinner, M.D.     GMO/MEDQ  D:  01/21/2009  T:  01/21/2009  Job:  [id number]

## 2011-01-25 NOTE — Op Note (Signed)
[name], [name]       ACCOUNT NO.:  [id number]   MEDICAL RECORD NO.:  [id number]          PATIENT TYPE:  OIB   LOCATION:  1528                         FACILITY:  [facility]   PHYSICIAN:  Jene Every, M.D.    DATE OF BIRTH:  Feb 13, 1951   DATE OF PROCEDURE:  08/01/2007  DATE OF DISCHARGE:                               OPERATIVE REPORT   PREOPERATIVE DIAGNOSES:  Rotator cuff tear and impingement syndrome,  right shoulder.   POSTOPERATIVE DIAGNOSES:  Rotator cuff tear and impingement syndrome,  right shoulder.   PROCEDURES PERFORMED:  1. Open rotator cuff repair.  2. Subacromial decompression with acromioplasty and bursectomy.  3. Repair of supraspinatus retracted rotator cuff tear utilizing Mitek      suture anchors.   ANESTHESIA:  General.   ASSISTANT:  Roma Schanz, P.A.   BRIEF HISTORY AND INDICATIONS:  This is a 60 year old with refractory  shoulder pain __________ a full-thickness tear of the rotator cuff.  She  was indicated for repair of a type 3 with subacromial decompression,  nontender over the [facility].  Risks and benefits discussed including bleeding,  infection, suboptimal range of motion, adhesive capsulitis, need for  revision, etc.   The patient in supine in a beach-chair position.  After induction of  adequate general anesthesia, 1 g vancomycin, she had been given a gram  of Kefzol, the right shoulder and upper extremity was prepped and draped  in the usual sterile fashion.  A surgical marker was utilized to  delineate the acromion.  An incision was made over the anterior aspect  of the acromion in Langer's lines.  Subcutaneous tissue was dissected.  Electrocautery was utilized to achieve hemostasis.  The raphe between  the anterolateral heads of the deltoid was identified, divided,  subperiosteally elevated from the anterolateral aspect to the  anteromedial aspect of the acromion.  A type 3 acromion was noted.  The  CA ligament was resected,  protected the deltoid and the rotator cuff.  Oscillating saw utilized to perform an acromioplasty.  This was removed  with a Matt Holmes rongeur, hypertrophic bursa was excised.  Rotator cuff tear  was noted.  There was a tear in the supraspinatus, retracted.  The  center portion of that was degenerated with multiple fraying,  longitudinal tears.  I debrided these longitudinal tears in the  __________ of the cuff to good bleeding tissue.  Prepared a bed with the  [facility] rongeur with good bleeding tissue.  I then mobilized the rotator  cuff with an Allis and digitally lysed adhesions, mobilizing the cuff  satisfactorily.  I then repaired side-to-side the supraspinatus tendon  with #1 Ethibond interrupted figure-of-eight sutures, two Mitek suture  anchors were placed into the bed and the tendon was repaired side-to-  side to these anchors.  This pulled the rotator cuff tendon together  intact with full coverage.  Good range of motion without significant  tension on the rotator cuff.  It was oversewed with 0 Vicryl interrupted  figure-of-eight sutures.  A good watertight closure was obtained, full  range, no further tears.  Wound copiously irrigated with antibiotic  irrigation.  Repaired the raphe with #  1 Vicryl interrupted figure-of-  eight sutures.  Subcutaneous tissue reapproximated 2-0 Vicryl simple  sutures.  The skin was reapproximated with 4-0 subcuticular  Prolene and the wound reinforced with Steri-Strips.  Sterile dressing  applied.  Placed supine on a hospital bed, extubated without difficulty  and transported to the recovery room in satisfactory condition.   The patient's tolerated the procedure well with no complications.      Jene Every, M.D.  Electronically Signed     JB/MEDQ  D:  08/01/2007  T:  08/01/2007  Job:  [id number]

## 2011-01-25 NOTE — Op Note (Signed)
Kristen Oliver, Kristen Oliver       ACCOUNT NO.:  [id number]   MEDICAL RECORD NO.:  [id number]          PATIENT TYPE:  AMB   LOCATION:  ENDO                         FACILITY:  [facility]   PHYSICIAN:  Georgiana Spinner, M.D.    DATE OF BIRTH:  26-Jul-1951   DATE OF PROCEDURE:  DATE OF DISCHARGE:                               OPERATIVE REPORT   PROCEDURE:  Upper endoscopy.   INDICATIONS:  Abdominal pain.   ANESTHESIA:  Fentanyl 25 mcg, Versed 2 mg.   PROCEDURE:  With the patient mildly sedated in the left lateral  decubitus position, the Pentax videoscopic endoscope was inserted in the  mouth and passed under direct vision through the esophagus which  appeared normal.  There was no evidence of esophagitis, Barrett  esophagus or abnormalities.  We entered into the stomach.  Fundus, body,  antrum, duodenal bulb, second portion of duodenum were visualized.  From  this point the endoscope was slowly withdrawn, taking circumferential  views of duodenal mucosa until the endoscope had been pulled back in the  stomach, placed in retroflexion to view the stomach from below.  The  endoscope was straightened and withdrawn, taking circumferential views  of remaining gastric and esophageal mucosa.  The patient's vital signs,  pulse oximeter remained stable.  The patient tolerated the procedure  well without apparent complications.   FINDINGS:  Loose wrap of the gastroesophageal junction around the  endoscope indicating laxity of the lower esophageal sphincter, otherwise  unremarkable examination.   PLAN:  Proceed to colonoscopy.           ______________________________  Georgiana Spinner, M.D.     GMO/MEDQ  D:  01/21/2009  T:  01/21/2009  Job:  [id number]

## 2011-01-28 NOTE — Op Note (Signed)
[name], Kristen Oliver             ACCOUNT NO.:  [id number]   MEDICAL RECORD NO.:  [id number]          PATIENT TYPE:  AMB   LOCATION:  DAY                          [facility]ACILITY:  [facility]   PHYSICIAN:  Thomas A. Cornett, M.D.DATE OF BIRTH:  1950-09-16   DATE OF PROCEDURE:  05/13/2005  DATE OF DISCHARGE:                                 OPERATIVE REPORT   PREOPERATIVE DIAGNOSIS:  Symptomatic cholelithiasis.   POSTOPERATIVE DIAGNOSIS:  Symptomatic cholelithiasis.   PROCEDURE:  Laparoscopic cholecystectomy with intraoperative cholangiogram.   SURGEON:  Thomas A. Cornett, M.D.   ASSISTANT:  Lebron Conners, M.D.   ANESTHESIA:  General endotracheal anesthesia.   ESTIMATED BLOOD LOSS:  10 cc.   SPECIMENS:  Gallbladder to pathology.   INDICATIONS FOR PROCEDURE:  The patient is a 60 year old female with  progressive right upper quadrant pain.  She is found to have extensive  gallstones, which I felt were symptomatic.  I recommended a laparoscopic  cholecystectomy for symptomatic cholelithiasis.   DESCRIPTION OF PROCEDURE:  Patient is brought to the operating suite and  placed supine.  After induction of general endotracheal anesthesia, her  abdomen was prepped and draped in a sterile fashion.  A 1 cm supraumbilical  incision  was made.  Dissection was carried down to her fascia.  Her fascia  was grasped with a Kocher, and a small incision was made.  I grabbed the  other side of the fascia with the Kocher and enlarged the incision.  I  placed a hemostat through the peritoneum into the intra-abdominal cavity.  A  Hasson cannula was then placed under direct vision and secured to the fascia  with the Vicryl suture.  Pneumoperitoneum was then created to 15 mmHg of  CO2, and a laparoscope was placed.  The patient was placed in reverse  Trendelenburg and rolled to his left.  A 10 mm port was placed in the  subxiphoid position.  Two 5 mm ports were placed under direct vision in the  right upper  quadrant.  The gallbladder dome was grasped and retracted  towards the patient's right shoulder.  A second grasper was used to grab the  infundibulum.  There were some adhesions of the duodenum taken down with a  combination of blunt dissection and the scissors.  Dissection was begun at  the junction of the cystic duct and infundibulum.  I scored the peritoneum  around this area and dissection up the cystic duct.  A clip was placed in  the gallbladder side of this, and a small cystic duct incision was made.  A  Cook catheter was placed.  Intraoperative cholangiogram was then done using  fluoroscopy as well as 1/2 strength Hypaque dye.  Intraoperative  cholangiogram revealed a large and tortuous cystic duct, a patent common  duct, and a patent common hepatic duct, which bifurcated into right and left  hepatic ducts.  There was free flow of bile into the duodenum.  At this  point in time, the cholangiogram was completed.  The cystic duct stump was  triply clipped and divided.  The cystic artery was identified, and it was  clipped and divided as well.  Cautery was used to control any oozing.  The  gallbladder was then dissected out from the gallbladder bed.  There was a  small amount of bile that was spilled during this procedure, but no stones  were spilled.  It was then placed in an EndoCatch bag and removed through  the umbilicus after it was dissected out of the gallbladder bed.  The  gallbladder bed was irrigated and was found to be hemostatic.  There was no  bleeding and no free flow of bile.  The irrigation was suctioned out until  clear.  At this point in time, I inspected the remainder of the abdominal  cavity and saw no other major abnormalities.  The 10 mm port and two 5 mm  ports were removed at this point in time with no evidence of port site  bleeding.  The camera was removed, and the umbilical port was removed.  The  purse-string suture at the umbilicus was then tied to close the  umbilical  fascia.  Monocryl 4-0 was used to close all skin incisions.  All sponge,  needle, and instrument counts were counted and found to be correct at this  portion of the case.  The patient was awakened and taken to the recovery  room in satisfactory condition.      Thomas A. Cornett, M.D.  Electronically Signed     TAC/MEDQ  D:  05/13/2005  T:  05/13/2005  Job:  [id number]

## 2011-01-28 NOTE — Op Note (Signed)
NAME:  Kristen Oliver, Kristen Oliver                       ACCOUNT NO.:  [id number]   MEDICAL RECORD NO.:  [id number]                   PATIENT TYPE:  AMB   LOCATION:  ENDO                                 FACILITY:  [facility]   PHYSICIAN:  Georgiana Spinner, M.D.                 DATE OF BIRTH:  02/22/1951   DATE OF PROCEDURE:  05/21/2004  DATE OF DISCHARGE:                                 OPERATIVE REPORT   PROCEDURE:  Upper endoscopy.   INDICATIONS:  GERD.   ANESTHESIA:  Demerol 60, Versed 7 mg.   DESCRIPTION OF PROCEDURE:  With patient mildly sedated in left lateral  decubitus position, the Olympus videoscopic endoscope was inserted in the  mouth, passed under direct vision through the esophagus, which appeared  normal.  There was no evidence of Barrett's esophagus.  We entered into the  stomach.  Fundus, body, antrum, duodenal bulb, and second portion of  duodenum all appeared normal.  From this point, the endoscope was slowly  withdrawn, taking circumferential views of the duodenal mucosa until the  endoscope then pulled back into the stomach, placed in retroflexion to view  the stomach from below, and a hiatal hernia was once again seen.  The  endoscope was then straightened and withdrawn, taking circumferential views  of the remaining gastric and esophageal mucosa.  The patient's vital signs  and pulse oximeter remained stable.  The patient tolerated the procedure  well without apparent complications.   FINDINGS:  1.  Hiatal hernia.  2.  Otherwise, unremarkable exam.   PLAN:  Proceed to colonoscopy.                                               Georgiana Spinner, M.D.    GMO/MEDQ  D:  05/21/2004  T:  05/21/2004  Job:  [id number]

## 2011-01-28 NOTE — H&P (Signed)
[place]. [facility]  Patient:    Kristen Oliver, Kristen Oliver [facility]                MRN: [id number] Adm. Date:  04/11/00 Attending:  Soyla Murphy. Renne Crigler, M.D. Dictator:   Rickard Patience, P.A. CC:         Everardo All. Madilyn Fireman, M.D.             John R. Aleen Campi, M.D.             Soyla Murphy. Renne Crigler, M.D.                         History and Physical  DATE OF BIRTH: 1951/02/11  CHIEF COMPLAINT: Ms. Kristen Oliver is a 60 year old single patient, who called [facility] complaining of chest pain for two days which was severe in nature.  HISTORY OF PRESENT ILLNESS: She was advised to be seen in the emergency room, where her chest pain persisted.  She was given oral nitroglycerin x 2 with some relief of her symptoms and was placed on a nitroglycerin drip, which completely relieved her symptoms.  She was scheduled for an emergent cardiac catheterization, which was performed by Dr. Charolette Child.  She was transferred from [facility] Emergency Room at that time to [facility] for her cardiac catheterization procedure.  Cardiac catheterization was apparently clear of any significant coronary artery disease.  She was transferred to a regular medical bed for observation.  I came in to see her on Tuesday afternoon at about 5 p.m. after her cardiac catheterization procedure.  She tells me that she had chest pain which had onset starting Saturday afternoon and grew more intense, and peaked around Monday night and early Tuesday morning, at which time she called our office. She states she had been taking her Protonix routinely and she does has a history of peptic ulcer disease and reflux which is followed by Dr. Dorena Cookey.  She had no associated diaphoresis or shortness of breath, or cough. She had no vomiting, although she does state she had some diarrhea Monday with four or five loose stools.  PAST MEDICAL HISTORY:  1. Peptic ulcer disease.  2. Migraine  headaches.  3. Allergic rhinitis.  4. Hypercholesterolemia.  5. Obesity.  6. History of right wrist fracture.  PAST SURGICAL HISTORY:  1. Abdominal hysterectomy secondary to myomata in 1993 by Dr. Cheryle Horsfall.  2. Bilateral tubal ligation via laparotomy in 1975.  3. In 1991 she underwent right knee arthroscopy.  CURRENT MEDICATIONS:  1. Lotensin 10/12.5 mg 1 q.d.  2. Evista 60 mg q.d.  3. Protonix 40 mg q.d.  ALLERGIES: No known drug allergies.  FAMILY HISTORY: Positive for diabetes and hypertension in her mother.  She has a sister with breast cancer.  Father deceased at age 62 from leukemia.  SOCIAL HISTORY: The patient is a native of [place], with 12 years of education.  She works as a Engineer, site at [employer].  She walks periodically for exercise.  No alcohol or tobacco use.  Currently she lives with her 50 year old daughter and two grandchildren.  PHYSICAL EXAMINATION:  GENERAL: The patient is an obese 60 year old African-American female, in no acute distress.  VITAL SIGNS: Blood pressure 150/85.  HEENT: Head normocephalic.  Oropharynx moist and clear.  NECK: Supple without adenopathy or thyromegaly.  No carotid bruits heard.  LUNGS: Clear to auscultation anteriorly and posteriorly, without rales, rhonchi, or  wheezing.  Respiratory rate is normal and unlabored.  CARDIOVASCULAR: Normal sinus rhythm.  S1 and S2, without murmurs, rubs, or gallops.  ABDOMEN: Good bowel sounds throughout.  Soft.  She is tender in her epigastric area.  There is no organomegaly.  No mass felt.  BREAST: Examination not repeated.  She had a normal breast examination last on April 06, 2000.  GU/RECTAL: Likewise deferred.  LABORATORY DATA: Recent laboratory studies are not available at this time. Her most recent ones done in our office on March 29, 2000 show a BUN of 17, creatinine 1.1.  Sodium 138, potassium 4.3, SGOT 15, SGPT 20.  Complete metabolic panel was  entirely within normal limits.  Hemoglobin was 12.2, hematocrit slightly decreased at 36.2; WBC 6.3; platelets 360,000.  Hemoglobin A1C was 5.82 on March 29, 2000.  Urinalysis was entirely clear.  IMPRESSION/PLAN: Substernal chest pain, apparently without any cardiac origin. We will consult Dr. Dorena Cookey.  She is placed on Aciphex 20 mg b.i.d. tonight.  She will have a bland diet.  We will conservatively follow her, drawing cardiac enzymes tonight and again tomorrow morning to rule out any cardiac ischemia. DD:  04/11/00 TD:  04/12/00 Job: [id number]

## 2011-01-28 NOTE — Cardiovascular Report (Signed)
[place]. [facility]  Patient:    Kristen Oliver                     MRN: [id number] Proc. Date: 04/11/00 Adm. Date:  [id number] Attending:  Silvestre Mesi CC:         Janae Bridgeman. Eloise Harman., M.D.             John R. Aleen Campi, M.D.             Cardiac Cath Lab                        Cardiac Catheterization  REFERRING PHYSICIAN:  Janae Bridgeman. Eloise Harman., M.D.  PROCEDURES: 1. Left heart catheterization. 2. Coronary cineangiography. 3. Left ventricular cineangiography. 4. Abdominal aortogram. 5. Perclose of the right femoral artery.  INDICATIONS FOR PROCEDURE:  This 60 year old female employee of [employer] presented to the emergency room with anterior chest pain and electrocardiogram showed ST elevation in her anterior leads.  She had several nitroglycerin sublingually with partial relief of the chest pain and then an IV nitroglycerin drip totally relieved the chest pain.  A repeat electrocardiogram showed resolution of the ST elevation and she was felt to have evidence for myocardial ischemia.  She was then transported to [facility] to the cardiac catheterization lab for an emergent cardiac cath.  DESCRIPTION OF PROCEDURE:  After signing an informed consent, the patient was premedicated with 50 mg of Benadryl intravenously and brought to the cardiac catheterization lab.  Her right groin was prepped and draped in a sterile fashion and anesthetized locally with 1% lidocaine.  A 6-French introducer sheath was inserted percutaneously into the right femoral artery.  6-French, 4 Judkins coronary catheters were used to make injections into the coronary arteries.  A 6-French pigtail catheter was used to measure pressuresin the left ventricle and aorta and to make midstream injections into the left ventricle and abdominal aorta.  The patient tolerated the procedure and no complications were noted at the end of the procedure.  The  catheter and sheath were removed form the right femoral artery and hemostasis was easily obtained with a Perclose closure system. The paitent had received heparin bolus in the emergency room and her PTT was elevated 250 and therefore, the elevation in PTT In combination with her hypertension was indication for using a Perclose system.  Of note, is that during the injection into the right femoral artery, in preparation for the Perclose system, we noted entemal tear in the right femoral artery with very good antegrade flow and no evidence of extravasation of dye. We elected to proceed with the Perclose procedure as noted above and had excellent results with normal closure.  MEDICATIONS:  None.  HEMODYNAMIC DATA:  Left ventricular pressure 173/8-24.  Aortic pressure 169/97 with a mean of 125.  Left ventricular ejection fraction was measured at 70%  CINE FINDINGS:  CORONARY CINEANGIOGRAPHY: 1. LEFT CORONARY ARTERY:  The ostium and left main appear normal. 2. LEFT ANTERIOR DESCENDING:  The LAD appears normal. 3. RIGHT CORONARY ARTERY:  The right coronary artery appears normal.  LEFT VENTRICULAR CINEANGIOGRAM:  The left ventricular chamber size in contractility appeared normal.  Left ventricular wall thickness appears normal.  Ejection fraction was measured at 70%.  The mitral and aortic valves appeared normal.  ABDOMINAL AORTOGRAM:  Midstream injection into the abdominal aorta reveals a normal appearing aorta without evidence of atherosclerosis and normal  appearing renal arteries.  FINAL DIAGNOSIS: 1. Normal coronary arteries. 2. Normal left ventricular function. 3. Normal abdominal aorta and renal arteries. 4. Normal mitral and aortic valves. 5. Incidental finding of intimal tear in the right femoral artery secondary to    percutaneous insertion of sheath. 6. Successful Perclose of the right femoral artery.  DISPOSITION:  Will monitor on 6500 until discharge.  Will discontinue  the heparin drip, discontinue the nitroglycerin and plan to follow-up with a duplex ultrasound of the right femoral artery in two weeks in the office.  Dr. Lendell Caprice requested that we start Vioxx 25 mg q.d. for 10 days and Protonix 40 mg q.d. DD:  04/11/00 TD:  04/12/00 Job: [id number]

## 2011-01-28 NOTE — Op Note (Signed)
   NAME:  Kristen Oliver, Kristen Oliver                       ACCOUNT NO.:  [id number]   MEDICAL RECORD NO.:  [id number]                   PATIENT TYPE:  AMB   LOCATION:  DSC                                  FACILITY:  [facility]   PHYSICIAN:  John L. Rendall III, M.D.           DATE OF BIRTH:  [birth year]   DATE OF PROCEDURE:  DATE OF DISCHARGE:                                 OPERATIVE REPORT   PREOPERATIVE DIAGNOSIS:  Torn left medial meniscus.   POSTOPERATIVE DIAGNOSIS:  Torn left medial meniscus.   PROCEDURE:  Left medial meniscectomy, left knee.   SURGEON:  John L. Rendall, M.D.   DESCRIPTION OF PROCEDURE:  Under MAC anesthesia with knee block, the left  knee is prepared with Betadine and draped in the sterile field to the  holder.  Standard arthroscopic portals were made and findings of a complex  posterior horn and tail of the medial meniscus are found.  Using a  combination of basket forceps and intra-articular shaver this was resected  back to stable meniscal rim, and debris is removed from the knee with a  combination of shaver and irrigation system.  The remainder of the knee is  carefully examined including the recesses, patellofemoral articulation, and  lateral compartments and no other abnormalities of significance are seen.  The knee was then flushed with saline, infiltrated with Marcaine and  morphine with epinephrine and a sterile compression bandage is applied.  The  patient returned to recovery in good condition.  She is given Vicodin for  home use.  Recheck in the office the following Tuesday.                                               John L. Dorothyann Gibbs, M.D.    Renato Gails  D:  03/10/2003  T:  03/10/2003  Job:  [id number]

## 2011-01-28 NOTE — Op Note (Signed)
NAME:  ARBUTUS, Kristen Oliver                       ACCOUNT NO.:  [id number]   MEDICAL RECORD NO.:  [id number]                   PATIENT TYPE:  AMB   LOCATION:  ENDO                                 FACILITY:  [facility]   PHYSICIAN:  Georgiana Spinner, M.D.                 DATE OF BIRTH:  06/16/1951   DATE OF PROCEDURE:  05/21/2004  DATE OF DISCHARGE:                                 OPERATIVE REPORT   PROCEDURE:  Colonoscopy.   INDICATIONS:  Colon polyps.   ANESTHESIA:  1.  Demerol 20.  2.  Versed 3 mg.   DESCRIPTION OF PROCEDURE:  With patient mildly sedated in the left lateral  decubitus position, the Olympus videoscopic colonoscope was inserted in the  rectum and with pressure applied to the abdomen, we reached the cecum,  identified by ileocecal valve and appendiceal orifice, both of which were  photographed.  From this point, the colonoscope was slowly withdrawn, taking  circumferential views of the colonic mucosa, stopping in the rectum which  appeared normal on direct and showed hemorrhoids on retroflex view.  The  endoscope was straightened, withdrawn.  The patient's vital signs and pulse  oximeter remained stable.  The patient tolerated the procedure well without  apparent complications.   FINDINGS:  Small internal hemorrhoids, otherwise unremarkable examination.   PLAN:  Repeat examination in 5 years.                                               Georgiana Spinner, M.D.    GMO/MEDQ  D:  05/21/2004  T:  05/21/2004  Job:  [id number]

## 2011-03-02 ENCOUNTER — Other Ambulatory Visit (HOSPITAL_COMMUNITY): Payer: Self-pay | Admitting: Internal Medicine

## 2011-03-02 DIAGNOSIS — Z1231 Encounter for screening mammogram for malignant neoplasm of breast: Secondary | ICD-10-CM

## 2011-03-18 ENCOUNTER — Ambulatory Visit (HOSPITAL_COMMUNITY): Payer: Self-pay | Attending: Internal Medicine

## 2011-06-01 LAB — CREATININE, SERUM
Creatinine, Ser: 0.96
GFR calc Af Amer: >60
GFR calc non Af Amer: >60

## 2011-06-01 LAB — BUN: BUN: 12

## 2011-06-21 LAB — COMPREHENSIVE METABOLIC PANEL
ALT: 23
AST: 18
Albumin: 3.6
Alkaline Phosphatase: 73
BUN: 15
CO2: 27
Calcium: 9.3
Chloride: 101
Creatinine, Ser: 0.83
GFR calc Af Amer: >60
GFR calc non Af Amer: >60
Glucose, Bld: 108 — ABNORMAL HIGH
Potassium: 3.2 — ABNORMAL LOW
Sodium: 136
Total Bilirubin: 0.7
Total Protein: 7.1

## 2011-06-21 LAB — CBC
HCT: 36
Hemoglobin: 12.1
MCHC: 33.7
MCV: 81.9
Platelets: 393
RBC: 4.39
RDW: 13.5
WBC: 6.9

## 2011-09-13 DIAGNOSIS — S8990XA Unspecified injury of unspecified lower leg, initial encounter: Secondary | ICD-10-CM | POA: Insufficient documentation

## 2012-04-19 ENCOUNTER — Ambulatory Visit (HOSPITAL_COMMUNITY): Payer: Self-pay

## 2012-04-20 ENCOUNTER — Other Ambulatory Visit (HOSPITAL_COMMUNITY): Payer: Self-pay | Admitting: Internal Medicine

## 2012-04-20 DIAGNOSIS — Z1231 Encounter for screening mammogram for malignant neoplasm of breast: Secondary | ICD-10-CM

## 2012-05-07 ENCOUNTER — Ambulatory Visit (HOSPITAL_COMMUNITY)
Admission: RE | Admit: 2012-05-07 | Discharge: 2012-05-07 | Disposition: A | Discharge status: Home / Self Care | Payer: Medicare Other | Source: Ambulatory Visit | Attending: Internal Medicine | Admitting: Internal Medicine

## 2012-05-07 ENCOUNTER — Ambulatory Visit (HOSPITAL_COMMUNITY): Payer: Self-pay

## 2012-05-07 DIAGNOSIS — Z1231 Encounter for screening mammogram for malignant neoplasm of breast: Secondary | ICD-10-CM | POA: Insufficient documentation

## 2012-09-01 ENCOUNTER — Emergency Department (HOSPITAL_COMMUNITY): Payer: Medicare Other

## 2012-09-01 ENCOUNTER — Encounter (HOSPITAL_COMMUNITY): Payer: Self-pay | Admitting: Emergency Medicine

## 2012-09-01 ENCOUNTER — Emergency Department (HOSPITAL_COMMUNITY)
Admission: EM | Admit: 2012-09-01 | Discharge: 2012-09-01 | Disposition: A | Discharge status: Home / Self Care | Payer: Medicare Other | Attending: Emergency Medicine | Admitting: Emergency Medicine

## 2012-09-01 DIAGNOSIS — I10 Essential (primary) hypertension: Secondary | ICD-10-CM | POA: Insufficient documentation

## 2012-09-01 DIAGNOSIS — Z8709 Personal history of other diseases of the respiratory system: Secondary | ICD-10-CM | POA: Insufficient documentation

## 2012-09-01 DIAGNOSIS — Y9289 Other specified places as the place of occurrence of the external cause: Secondary | ICD-10-CM | POA: Insufficient documentation

## 2012-09-01 DIAGNOSIS — IMO0002 Reserved for concepts with insufficient information to code with codable children: Secondary | ICD-10-CM | POA: Insufficient documentation

## 2012-09-01 DIAGNOSIS — Z79899 Other long term (current) drug therapy: Secondary | ICD-10-CM | POA: Insufficient documentation

## 2012-09-01 DIAGNOSIS — Y9389 Activity, other specified: Secondary | ICD-10-CM | POA: Insufficient documentation

## 2012-09-01 DIAGNOSIS — S8000XA Contusion of unspecified knee, initial encounter: Secondary | ICD-10-CM | POA: Insufficient documentation

## 2012-09-01 MED ORDER — HYDROCODONE-ACETAMINOPHEN 5-325 MG PO TABS: 1.0000 | ORAL_TABLET | Freq: Four times a day (QID) | ORAL | Status: DC | PRN

## 2012-09-01 MED ORDER — IBUPROFEN 800 MG PO TABS: 800.0000 mg | ORAL_TABLET | Freq: Three times a day (TID) | ORAL | Status: DC | PRN
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
## 2012-09-01 NOTE — ED Notes (Signed)
Patient is alert and oriented x3.  She was given DC instructions and follow up visit instructions.  Patient gave verbal understanding. She was DC ambulatory under her own power to home.  V/S stable.  He was not showing any signs of distress on DC 

## 2012-09-01 NOTE — ED Provider Notes (Signed)
Medical screening examination/treatment/procedure(s) were performed by non-physician practitioner and as supervising physician I was immediately available for consultation/collaboration.   Lyanne Co, MD 09/01/12 [phone]
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
## 2012-09-12 DIAGNOSIS — N649 Disorder of breast, unspecified: Secondary | ICD-10-CM | POA: Insufficient documentation

## 2012-11-27 ENCOUNTER — Ambulatory Visit: Payer: Medicare Other | Admitting: Physical Therapy

## 2012-12-06 ENCOUNTER — Ambulatory Visit: Payer: Medicare Other

## 2012-12-07 ENCOUNTER — Ambulatory Visit: Payer: Medicare Other | Attending: Pediatrics

## 2012-12-07 DIAGNOSIS — R262 Difficulty in walking, not elsewhere classified: Secondary | ICD-10-CM | POA: Insufficient documentation

## 2012-12-07 DIAGNOSIS — M25569 Pain in unspecified knee: Secondary | ICD-10-CM | POA: Insufficient documentation

## 2012-12-07 DIAGNOSIS — IMO0001 Reserved for inherently not codable concepts without codable children: Secondary | ICD-10-CM | POA: Insufficient documentation

## 2012-12-12 ENCOUNTER — Ambulatory Visit: Payer: Medicare Other | Attending: Pediatrics | Admitting: Physical Therapy

## 2012-12-12 DIAGNOSIS — M25569 Pain in unspecified knee: Secondary | ICD-10-CM | POA: Insufficient documentation

## 2012-12-12 DIAGNOSIS — IMO0001 Reserved for inherently not codable concepts without codable children: Secondary | ICD-10-CM | POA: Insufficient documentation

## 2012-12-12 DIAGNOSIS — R262 Difficulty in walking, not elsewhere classified: Secondary | ICD-10-CM | POA: Insufficient documentation

## 2012-12-17 ENCOUNTER — Ambulatory Visit: Payer: Medicare Other | Admitting: Physical Therapy

## 2012-12-24 ENCOUNTER — Ambulatory Visit: Payer: Medicare Other | Admitting: Physical Therapy

## 2012-12-27 ENCOUNTER — Ambulatory Visit: Payer: Medicare Other | Admitting: Physical Therapy

## 2013-01-01 ENCOUNTER — Ambulatory Visit: Payer: Medicare Other | Admitting: Physical Therapy

## 2013-01-04 ENCOUNTER — Ambulatory Visit: Payer: Medicare Other

## 2013-01-14 ENCOUNTER — Ambulatory Visit: Payer: Medicare Other | Attending: Pediatrics

## 2013-01-14 DIAGNOSIS — R262 Difficulty in walking, not elsewhere classified: Secondary | ICD-10-CM | POA: Insufficient documentation

## 2013-01-14 DIAGNOSIS — M25569 Pain in unspecified knee: Secondary | ICD-10-CM | POA: Insufficient documentation

## 2013-01-14 DIAGNOSIS — IMO0001 Reserved for inherently not codable concepts without codable children: Secondary | ICD-10-CM | POA: Insufficient documentation

## 2013-01-17 ENCOUNTER — Ambulatory Visit: Payer: Medicare Other

## 2013-01-21 ENCOUNTER — Ambulatory Visit: Payer: Medicare Other

## 2013-01-24 ENCOUNTER — Ambulatory Visit: Payer: Medicare Other

## 2013-01-28 ENCOUNTER — Other Ambulatory Visit: Payer: Self-pay | Admitting: Specialist

## 2013-01-28 DIAGNOSIS — M222X2 Patellofemoral disorders, left knee: Secondary | ICD-10-CM

## 2013-01-28 DIAGNOSIS — M222X1 Patellofemoral disorders, right knee: Secondary | ICD-10-CM

## 2013-01-31 ENCOUNTER — Ambulatory Visit
Admission: RE | Admit: 2013-01-31 | Discharge: 2013-01-31 | Disposition: A | Discharge status: Home / Self Care | Payer: Medicare Other | Source: Ambulatory Visit | Attending: Specialist | Admitting: Specialist

## 2013-01-31 DIAGNOSIS — M222X1 Patellofemoral disorders, right knee: Secondary | ICD-10-CM

## 2013-01-31 DIAGNOSIS — M222X2 Patellofemoral disorders, left knee: Secondary | ICD-10-CM

## 2013-04-15 ENCOUNTER — Other Ambulatory Visit (HOSPITAL_COMMUNITY): Payer: Self-pay | Admitting: Internal Medicine

## 2013-04-15 DIAGNOSIS — Z1231 Encounter for screening mammogram for malignant neoplasm of breast: Secondary | ICD-10-CM

## 2013-05-08 ENCOUNTER — Ambulatory Visit (HOSPITAL_COMMUNITY): Payer: Medicare Other

## 2013-05-22 ENCOUNTER — Ambulatory Visit (HOSPITAL_COMMUNITY)
Admission: RE | Admit: 2013-05-22 | Discharge: 2013-05-22 | Disposition: A | Discharge status: Home / Self Care | Payer: Medicare Other | Source: Ambulatory Visit | Attending: Internal Medicine | Admitting: Internal Medicine

## 2013-05-22 DIAGNOSIS — Z1231 Encounter for screening mammogram for malignant neoplasm of breast: Secondary | ICD-10-CM | POA: Insufficient documentation

## 2013-05-28 ENCOUNTER — Other Ambulatory Visit: Payer: Self-pay | Admitting: Internal Medicine

## 2013-05-28 DIAGNOSIS — R928 Other abnormal and inconclusive findings on diagnostic imaging of breast: Secondary | ICD-10-CM

## 2013-06-05 ENCOUNTER — Other Ambulatory Visit: Payer: Self-pay | Admitting: Internal Medicine

## 2013-06-05 ENCOUNTER — Ambulatory Visit
Admission: RE | Admit: 2013-06-05 | Discharge: 2013-06-05 | Disposition: A | Discharge status: Home / Self Care | Payer: Medicare Other | Source: Ambulatory Visit | Attending: Internal Medicine | Admitting: Internal Medicine

## 2013-06-05 DIAGNOSIS — R921 Mammographic calcification found on diagnostic imaging of breast: Secondary | ICD-10-CM

## 2013-06-05 DIAGNOSIS — R928 Other abnormal and inconclusive findings on diagnostic imaging of breast: Secondary | ICD-10-CM

## 2013-06-12 ENCOUNTER — Ambulatory Visit
Admission: RE | Admit: 2013-06-12 | Discharge: 2013-06-12 | Disposition: A | Discharge status: Home / Self Care | Payer: Medicare Other | Source: Ambulatory Visit | Attending: Internal Medicine | Admitting: Internal Medicine

## 2013-06-12 DIAGNOSIS — R921 Mammographic calcification found on diagnostic imaging of breast: Secondary | ICD-10-CM

## 2013-11-11 ENCOUNTER — Other Ambulatory Visit: Payer: Self-pay | Admitting: Internal Medicine

## 2013-11-11 DIAGNOSIS — R921 Mammographic calcification found on diagnostic imaging of breast: Secondary | ICD-10-CM

## 2013-12-12 ENCOUNTER — Ambulatory Visit
Admission: RE | Admit: 2013-12-12 | Discharge: 2013-12-12 | Disposition: A | Discharge status: Home / Self Care | Payer: Commercial Managed Care - HMO | Source: Ambulatory Visit | Attending: Internal Medicine | Admitting: Internal Medicine

## 2013-12-12 DIAGNOSIS — R921 Mammographic calcification found on diagnostic imaging of breast: Secondary | ICD-10-CM

## 2014-04-14 ENCOUNTER — Other Ambulatory Visit: Payer: Self-pay

## 2014-04-14 DIAGNOSIS — Z1231 Encounter for screening mammogram for malignant neoplasm of breast: Secondary | ICD-10-CM

## 2014-05-27 ENCOUNTER — Encounter (INDEPENDENT_AMBULATORY_CARE_PROVIDER_SITE_OTHER): Payer: Self-pay

## 2014-05-27 ENCOUNTER — Ambulatory Visit
Admission: RE | Admit: 2014-05-27 | Discharge: 2014-05-27 | Disposition: A | Discharge status: Home / Self Care | Payer: Commercial Managed Care - HMO | Source: Ambulatory Visit

## 2014-05-27 DIAGNOSIS — Z1231 Encounter for screening mammogram for malignant neoplasm of breast: Secondary | ICD-10-CM

## 2014-09-30 DIAGNOSIS — R6889 Other general symptoms and signs: Secondary | ICD-10-CM | POA: Diagnosis not present

## 2014-09-30 DIAGNOSIS — R351 Nocturia: Secondary | ICD-10-CM | POA: Diagnosis not present

## 2014-09-30 DIAGNOSIS — I10 Essential (primary) hypertension: Secondary | ICD-10-CM | POA: Diagnosis not present

## 2014-09-30 DIAGNOSIS — K219 Gastro-esophageal reflux disease without esophagitis: Secondary | ICD-10-CM | POA: Diagnosis not present

## 2014-11-04 DIAGNOSIS — Z130 Encounter for screening for diseases of the blood and blood-forming organs and certain disorders involving the immune mechanism: Secondary | ICD-10-CM | POA: Diagnosis not present

## 2014-11-04 DIAGNOSIS — Z1389 Encounter for screening for other disorder: Secondary | ICD-10-CM | POA: Diagnosis not present

## 2014-11-04 DIAGNOSIS — R6889 Other general symptoms and signs: Secondary | ICD-10-CM | POA: Diagnosis not present

## 2014-11-04 DIAGNOSIS — Z800 Family history of malignant neoplasm of digestive organs: Secondary | ICD-10-CM | POA: Diagnosis not present

## 2014-11-04 DIAGNOSIS — I10 Essential (primary) hypertension: Secondary | ICD-10-CM | POA: Diagnosis not present

## 2014-11-04 DIAGNOSIS — F419 Anxiety disorder, unspecified: Secondary | ICD-10-CM | POA: Diagnosis not present

## 2014-11-04 DIAGNOSIS — Z01419 Encounter for gynecological examination (general) (routine) without abnormal findings: Secondary | ICD-10-CM | POA: Diagnosis not present

## 2014-12-02 DIAGNOSIS — F419 Anxiety disorder, unspecified: Secondary | ICD-10-CM | POA: Diagnosis not present

## 2014-12-02 DIAGNOSIS — I10 Essential (primary) hypertension: Secondary | ICD-10-CM | POA: Diagnosis not present

## 2014-12-29 DIAGNOSIS — I10 Essential (primary) hypertension: Secondary | ICD-10-CM | POA: Diagnosis not present

## 2014-12-29 DIAGNOSIS — E876 Hypokalemia: Secondary | ICD-10-CM | POA: Diagnosis not present

## 2014-12-31 DIAGNOSIS — I10 Essential (primary) hypertension: Secondary | ICD-10-CM | POA: Diagnosis not present

## 2015-01-14 DIAGNOSIS — E876 Hypokalemia: Secondary | ICD-10-CM | POA: Diagnosis not present

## 2015-03-11 DIAGNOSIS — Z0001 Encounter for general adult medical examination with abnormal findings: Secondary | ICD-10-CM | POA: Diagnosis not present

## 2015-03-11 DIAGNOSIS — I10 Essential (primary) hypertension: Secondary | ICD-10-CM | POA: Diagnosis not present

## 2015-03-17 DIAGNOSIS — H02409 Unspecified ptosis of unspecified eyelid: Secondary | ICD-10-CM | POA: Diagnosis not present

## 2015-03-17 DIAGNOSIS — M170 Bilateral primary osteoarthritis of knee: Secondary | ICD-10-CM | POA: Diagnosis not present

## 2015-03-17 DIAGNOSIS — R7309 Other abnormal glucose: Secondary | ICD-10-CM | POA: Diagnosis not present

## 2015-03-17 DIAGNOSIS — Z0000 Encounter for general adult medical examination without abnormal findings: Secondary | ICD-10-CM | POA: Diagnosis not present

## 2015-03-17 DIAGNOSIS — K219 Gastro-esophageal reflux disease without esophagitis: Secondary | ICD-10-CM | POA: Diagnosis not present

## 2015-03-17 DIAGNOSIS — I10 Essential (primary) hypertension: Secondary | ICD-10-CM | POA: Diagnosis not present

## 2015-03-17 DIAGNOSIS — D473 Essential (hemorrhagic) thrombocythemia: Secondary | ICD-10-CM | POA: Diagnosis not present

## 2015-03-17 DIAGNOSIS — R6889 Other general symptoms and signs: Secondary | ICD-10-CM | POA: Diagnosis not present

## 2015-03-17 DIAGNOSIS — E876 Hypokalemia: Secondary | ICD-10-CM | POA: Diagnosis not present

## 2015-03-18 DIAGNOSIS — M25562 Pain in left knee: Secondary | ICD-10-CM | POA: Diagnosis not present

## 2015-03-18 DIAGNOSIS — M179 Osteoarthritis of knee, unspecified: Secondary | ICD-10-CM | POA: Diagnosis not present

## 2015-03-18 DIAGNOSIS — M1711 Unilateral primary osteoarthritis, right knee: Secondary | ICD-10-CM | POA: Diagnosis not present

## 2015-04-15 DIAGNOSIS — H2513 Age-related nuclear cataract, bilateral: Secondary | ICD-10-CM | POA: Diagnosis not present

## 2015-04-27 DIAGNOSIS — E8881 Metabolic syndrome: Secondary | ICD-10-CM | POA: Diagnosis not present

## 2015-04-27 DIAGNOSIS — Z79899 Other long term (current) drug therapy: Secondary | ICD-10-CM | POA: Diagnosis not present

## 2015-04-27 DIAGNOSIS — E876 Hypokalemia: Secondary | ICD-10-CM | POA: Diagnosis not present

## 2015-04-27 DIAGNOSIS — R7309 Other abnormal glucose: Secondary | ICD-10-CM | POA: Diagnosis not present

## 2015-04-28 ENCOUNTER — Other Ambulatory Visit: Payer: Self-pay

## 2015-04-28 DIAGNOSIS — Z803 Family history of malignant neoplasm of breast: Secondary | ICD-10-CM

## 2015-04-28 DIAGNOSIS — Z1231 Encounter for screening mammogram for malignant neoplasm of breast: Secondary | ICD-10-CM

## 2015-04-30 DIAGNOSIS — E8881 Metabolic syndrome: Secondary | ICD-10-CM | POA: Diagnosis not present

## 2015-04-30 DIAGNOSIS — E876 Hypokalemia: Secondary | ICD-10-CM | POA: Diagnosis not present

## 2015-05-04 DIAGNOSIS — R6889 Other general symptoms and signs: Secondary | ICD-10-CM | POA: Diagnosis not present

## 2015-05-25 DIAGNOSIS — E8881 Metabolic syndrome: Secondary | ICD-10-CM | POA: Diagnosis not present

## 2015-05-25 DIAGNOSIS — E669 Obesity, unspecified: Secondary | ICD-10-CM | POA: Diagnosis not present

## 2015-05-25 DIAGNOSIS — I10 Essential (primary) hypertension: Secondary | ICD-10-CM | POA: Diagnosis not present

## 2015-06-02 ENCOUNTER — Ambulatory Visit
Admission: RE | Admit: 2015-06-02 | Discharge: 2015-06-02 | Disposition: A | Discharge status: Home / Self Care | Payer: Commercial Managed Care - HMO | Source: Ambulatory Visit

## 2015-06-02 DIAGNOSIS — Z1231 Encounter for screening mammogram for malignant neoplasm of breast: Secondary | ICD-10-CM

## 2015-06-02 DIAGNOSIS — Z803 Family history of malignant neoplasm of breast: Secondary | ICD-10-CM

## 2015-10-12 DIAGNOSIS — R7309 Other abnormal glucose: Secondary | ICD-10-CM | POA: Diagnosis not present

## 2015-10-12 DIAGNOSIS — E669 Obesity, unspecified: Secondary | ICD-10-CM | POA: Diagnosis not present

## 2015-10-12 DIAGNOSIS — I10 Essential (primary) hypertension: Secondary | ICD-10-CM | POA: Diagnosis not present

## 2016-03-11 DIAGNOSIS — Z0000 Encounter for general adult medical examination without abnormal findings: Secondary | ICD-10-CM | POA: Diagnosis not present

## 2016-03-11 DIAGNOSIS — I10 Essential (primary) hypertension: Secondary | ICD-10-CM | POA: Diagnosis not present

## 2016-03-21 DIAGNOSIS — E8881 Metabolic syndrome: Secondary | ICD-10-CM | POA: Diagnosis not present

## 2016-03-21 DIAGNOSIS — R350 Frequency of micturition: Secondary | ICD-10-CM | POA: Diagnosis not present

## 2016-03-21 DIAGNOSIS — E559 Vitamin D deficiency, unspecified: Secondary | ICD-10-CM | POA: Diagnosis not present

## 2016-03-21 DIAGNOSIS — Z0000 Encounter for general adult medical examination without abnormal findings: Secondary | ICD-10-CM | POA: Diagnosis not present

## 2016-04-04 DIAGNOSIS — Z0000 Encounter for general adult medical examination without abnormal findings: Secondary | ICD-10-CM | POA: Diagnosis not present

## 2016-04-04 DIAGNOSIS — E876 Hypokalemia: Secondary | ICD-10-CM | POA: Diagnosis not present

## 2016-04-04 DIAGNOSIS — I10 Essential (primary) hypertension: Secondary | ICD-10-CM | POA: Diagnosis not present

## 2016-04-07 DIAGNOSIS — M179 Osteoarthritis of knee, unspecified: Secondary | ICD-10-CM | POA: Diagnosis not present

## 2016-04-22 DIAGNOSIS — Z01419 Encounter for gynecological examination (general) (routine) without abnormal findings: Secondary | ICD-10-CM | POA: Diagnosis not present

## 2016-04-28 ENCOUNTER — Other Ambulatory Visit: Payer: Self-pay | Admitting: Internal Medicine

## 2016-04-28 DIAGNOSIS — Z1231 Encounter for screening mammogram for malignant neoplasm of breast: Secondary | ICD-10-CM

## 2016-05-18 DIAGNOSIS — M25562 Pain in left knee: Secondary | ICD-10-CM | POA: Diagnosis not present

## 2016-05-18 DIAGNOSIS — M25561 Pain in right knee: Secondary | ICD-10-CM | POA: Diagnosis not present

## 2016-05-24 DIAGNOSIS — M25511 Pain in right shoulder: Secondary | ICD-10-CM | POA: Diagnosis not present

## 2016-05-24 DIAGNOSIS — Z23 Encounter for immunization: Secondary | ICD-10-CM | POA: Diagnosis not present

## 2016-05-24 DIAGNOSIS — G8929 Other chronic pain: Secondary | ICD-10-CM | POA: Diagnosis not present

## 2016-06-02 ENCOUNTER — Ambulatory Visit: Payer: Commercial Managed Care - HMO

## 2016-07-07 ENCOUNTER — Ambulatory Visit
Admission: RE | Admit: 2016-07-07 | Discharge: 2016-07-07 | Disposition: A | Discharge status: Home / Self Care | Payer: Commercial Managed Care - HMO | Source: Ambulatory Visit | Attending: Internal Medicine | Admitting: Internal Medicine

## 2016-07-07 DIAGNOSIS — Z1231 Encounter for screening mammogram for malignant neoplasm of breast: Secondary | ICD-10-CM | POA: Diagnosis not present

## 2016-07-11 ENCOUNTER — Other Ambulatory Visit: Payer: Self-pay | Admitting: Internal Medicine

## 2016-07-11 DIAGNOSIS — R928 Other abnormal and inconclusive findings on diagnostic imaging of breast: Secondary | ICD-10-CM

## 2016-07-15 ENCOUNTER — Ambulatory Visit
Admission: RE | Admit: 2016-07-15 | Discharge: 2016-07-15 | Disposition: A | Discharge status: Home / Self Care | Payer: Commercial Managed Care - HMO | Source: Ambulatory Visit | Attending: Internal Medicine | Admitting: Internal Medicine

## 2016-07-15 DIAGNOSIS — R928 Other abnormal and inconclusive findings on diagnostic imaging of breast: Secondary | ICD-10-CM

## 2016-07-15 DIAGNOSIS — N6489 Other specified disorders of breast: Secondary | ICD-10-CM | POA: Diagnosis not present

## 2016-07-26 DIAGNOSIS — E559 Vitamin D deficiency, unspecified: Secondary | ICD-10-CM | POA: Diagnosis not present

## 2016-10-19 DIAGNOSIS — M1711 Unilateral primary osteoarthritis, right knee: Secondary | ICD-10-CM | POA: Diagnosis not present

## 2017-03-24 DIAGNOSIS — I10 Essential (primary) hypertension: Secondary | ICD-10-CM | POA: Diagnosis not present

## 2017-03-24 DIAGNOSIS — Z0000 Encounter for general adult medical examination without abnormal findings: Secondary | ICD-10-CM | POA: Diagnosis not present

## 2017-03-31 DIAGNOSIS — M25561 Pain in right knee: Secondary | ICD-10-CM | POA: Diagnosis not present

## 2017-03-31 DIAGNOSIS — Z0000 Encounter for general adult medical examination without abnormal findings: Secondary | ICD-10-CM | POA: Diagnosis not present

## 2017-03-31 DIAGNOSIS — Z23 Encounter for immunization: Secondary | ICD-10-CM | POA: Diagnosis not present

## 2017-03-31 DIAGNOSIS — I10 Essential (primary) hypertension: Secondary | ICD-10-CM | POA: Diagnosis not present

## 2017-05-29 DIAGNOSIS — Z01419 Encounter for gynecological examination (general) (routine) without abnormal findings: Secondary | ICD-10-CM | POA: Diagnosis not present

## 2017-06-14 ENCOUNTER — Other Ambulatory Visit: Payer: Self-pay | Admitting: Internal Medicine

## 2017-06-14 DIAGNOSIS — Z1231 Encounter for screening mammogram for malignant neoplasm of breast: Secondary | ICD-10-CM

## 2017-07-03 DIAGNOSIS — J069 Acute upper respiratory infection, unspecified: Secondary | ICD-10-CM | POA: Diagnosis not present

## 2017-07-11 ENCOUNTER — Ambulatory Visit
Admission: RE | Admit: 2017-07-11 | Discharge: 2017-07-11 | Disposition: A | Discharge status: Home / Self Care | Payer: Medicare HMO | Source: Ambulatory Visit | Attending: Internal Medicine | Admitting: Internal Medicine

## 2017-07-11 DIAGNOSIS — Z1231 Encounter for screening mammogram for malignant neoplasm of breast: Secondary | ICD-10-CM

## 2017-07-21 DIAGNOSIS — Z01419 Encounter for gynecological examination (general) (routine) without abnormal findings: Secondary | ICD-10-CM | POA: Diagnosis not present

## 2017-08-23 DIAGNOSIS — M1711 Unilateral primary osteoarthritis, right knee: Secondary | ICD-10-CM | POA: Diagnosis not present

## 2017-08-29 DIAGNOSIS — M170 Bilateral primary osteoarthritis of knee: Secondary | ICD-10-CM | POA: Diagnosis not present

## 2017-08-29 DIAGNOSIS — M1711 Unilateral primary osteoarthritis, right knee: Secondary | ICD-10-CM | POA: Diagnosis not present

## 2017-08-29 DIAGNOSIS — M179 Osteoarthritis of knee, unspecified: Secondary | ICD-10-CM | POA: Diagnosis not present

## 2017-08-29 DIAGNOSIS — M25561 Pain in right knee: Secondary | ICD-10-CM | POA: Diagnosis not present

## 2017-11-01 DIAGNOSIS — M1711 Unilateral primary osteoarthritis, right knee: Secondary | ICD-10-CM | POA: Diagnosis not present

## 2017-11-01 DIAGNOSIS — M170 Bilateral primary osteoarthritis of knee: Secondary | ICD-10-CM | POA: Diagnosis not present

## 2017-11-01 DIAGNOSIS — M25561 Pain in right knee: Secondary | ICD-10-CM | POA: Diagnosis not present

## 2017-11-14 DIAGNOSIS — F419 Anxiety disorder, unspecified: Secondary | ICD-10-CM | POA: Diagnosis not present

## 2017-11-14 DIAGNOSIS — J309 Allergic rhinitis, unspecified: Secondary | ICD-10-CM | POA: Diagnosis not present

## 2017-11-14 DIAGNOSIS — I10 Essential (primary) hypertension: Secondary | ICD-10-CM | POA: Diagnosis not present

## 2017-11-15 DIAGNOSIS — M170 Bilateral primary osteoarthritis of knee: Secondary | ICD-10-CM | POA: Diagnosis not present

## 2017-11-15 DIAGNOSIS — M25561 Pain in right knee: Secondary | ICD-10-CM | POA: Diagnosis not present

## 2017-11-15 DIAGNOSIS — M1711 Unilateral primary osteoarthritis, right knee: Secondary | ICD-10-CM | POA: Diagnosis not present

## 2017-12-14 DIAGNOSIS — J301 Allergic rhinitis due to pollen: Secondary | ICD-10-CM | POA: Diagnosis not present

## 2018-03-28 DIAGNOSIS — I10 Essential (primary) hypertension: Secondary | ICD-10-CM | POA: Diagnosis not present

## 2018-03-28 DIAGNOSIS — E559 Vitamin D deficiency, unspecified: Secondary | ICD-10-CM | POA: Diagnosis not present

## 2018-04-05 DIAGNOSIS — E876 Hypokalemia: Secondary | ICD-10-CM | POA: Diagnosis not present

## 2018-04-05 DIAGNOSIS — Z23 Encounter for immunization: Secondary | ICD-10-CM | POA: Diagnosis not present

## 2018-04-05 DIAGNOSIS — Z0000 Encounter for general adult medical examination without abnormal findings: Secondary | ICD-10-CM | POA: Diagnosis not present

## 2018-04-05 DIAGNOSIS — I10 Essential (primary) hypertension: Secondary | ICD-10-CM | POA: Diagnosis not present

## 2018-04-05 DIAGNOSIS — E559 Vitamin D deficiency, unspecified: Secondary | ICD-10-CM | POA: Diagnosis not present

## 2018-04-11 DIAGNOSIS — E876 Hypokalemia: Secondary | ICD-10-CM | POA: Diagnosis not present

## 2018-06-01 DIAGNOSIS — Z130 Encounter for screening for diseases of the blood and blood-forming organs and certain disorders involving the immune mechanism: Secondary | ICD-10-CM | POA: Diagnosis not present

## 2018-06-01 DIAGNOSIS — R6889 Other general symptoms and signs: Secondary | ICD-10-CM | POA: Diagnosis not present

## 2018-06-01 DIAGNOSIS — Z6833 Body mass index (BMI) 33.0-33.9, adult: Secondary | ICD-10-CM | POA: Diagnosis not present

## 2018-06-01 DIAGNOSIS — Z1389 Encounter for screening for other disorder: Secondary | ICD-10-CM | POA: Diagnosis not present

## 2018-06-01 DIAGNOSIS — Z01419 Encounter for gynecological examination (general) (routine) without abnormal findings: Secondary | ICD-10-CM | POA: Diagnosis not present

## 2018-06-21 ENCOUNTER — Other Ambulatory Visit: Payer: Self-pay | Admitting: Internal Medicine

## 2018-06-21 DIAGNOSIS — Z1231 Encounter for screening mammogram for malignant neoplasm of breast: Secondary | ICD-10-CM

## 2018-06-27 DIAGNOSIS — Z23 Encounter for immunization: Secondary | ICD-10-CM | POA: Diagnosis not present

## 2018-07-14 DIAGNOSIS — Z01419 Encounter for gynecological examination (general) (routine) without abnormal findings: Secondary | ICD-10-CM | POA: Diagnosis not present

## 2018-07-25 ENCOUNTER — Ambulatory Visit: Payer: Medicare HMO

## 2018-08-15 DIAGNOSIS — I10 Essential (primary) hypertension: Secondary | ICD-10-CM | POA: Diagnosis not present

## 2018-08-15 DIAGNOSIS — M12811 Other specific arthropathies, not elsewhere classified, right shoulder: Secondary | ICD-10-CM | POA: Diagnosis not present

## 2018-09-06 ENCOUNTER — Ambulatory Visit: Payer: Medicare HMO

## 2018-09-19 ENCOUNTER — Ambulatory Visit (INDEPENDENT_AMBULATORY_CARE_PROVIDER_SITE_OTHER): Payer: Medicare HMO

## 2018-09-19 ENCOUNTER — Encounter (INDEPENDENT_AMBULATORY_CARE_PROVIDER_SITE_OTHER): Payer: Self-pay | Admitting: Orthopedic Surgery

## 2018-09-19 ENCOUNTER — Ambulatory Visit (INDEPENDENT_AMBULATORY_CARE_PROVIDER_SITE_OTHER): Payer: Medicare HMO | Admitting: Orthopedic Surgery

## 2018-09-19 DIAGNOSIS — G8929 Other chronic pain: Secondary | ICD-10-CM

## 2018-09-19 DIAGNOSIS — M25521 Pain in right elbow: Secondary | ICD-10-CM

## 2018-09-19 DIAGNOSIS — M25511 Pain in right shoulder: Secondary | ICD-10-CM

## 2018-09-19 NOTE — Progress Notes (Signed)
Office Visit Note   Patient: Kristen Oliver           Date of Birth: 04-Feb-1951           MRN: [id number] Visit Date: 09/19/2018 Requested by: Deland Pretty, MD [id number] [place], [place] [address] PCP: Deland Pretty, MD  Subjective: Chief Complaint  Patient presents with  . Right Shoulder - Pain    HPI: Patient presents for evaluation of right shoulder and elbow pain.  She had rotator cuff surgery in 2007 but she was throwing some garbage into a dumpster in December.  Had primarily elbow and some shoulder pain at that time.  Hurts when she does movements.  Localizes the pain primarily to the lateral epicondyle region.  Taking Tylenol and Advil which did not help.  Also had a prednisone Dosepak which did not help.  The pain does radiate down her arms at times.  No real neck pain with that.  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  Topical cream also has not been helpful.              ROS: All systems reviewed are negative as they relate to the chief complaint within the history of present illness.  Patient denies  fevers or chills.   Assessment & Plan: Visit Diagnoses:  1. Chronic right shoulder pain   2. Pain in right elbow     Plan: Impression is pretty normal shoulder exam with right lateral elbow pain consistent with common extensor tendinosis.  Radiographs normal.  I think for her that the optimal treatment would be platelet rich plasma injection based on recent literature in the journal of shoulder and elbow surgery.  However that would be a significant out-of-pocket expense for her.  I do not think cortisone is a great idea due to his short-term pain relief.  For now we will get a try tennis elbow brace.  I also encouraged her to do cardiovascular exercise to increase blood flow.  Would consider nitroglycerin patch for the elbow if the tennis elbow brace does not help.  Follow-Up Instructions: Return if symptoms worsen or fail to  improve.   Orders:  Orders Placed This Encounter  Procedures  . XR Shoulder Right  . XR Elbow Complete Right (3+View)   No orders of the defined types were placed in this encounter.     Procedures: No procedures performed   Clinical Data: No additional findings.  Objective: Vital Signs: There were no vitals taken for this visit.  Physical Exam:   Constitutional: Patient appears well-developed HEENT:  Head: Normocephalic Eyes:EOM are normal Neck: Normal range of motion Cardiovascular: Normal rate Pulmonary/chest: Effort normal Neurologic: Patient is alert Skin: Skin is warm Psychiatric: Patient has normal mood and affect    Ortho Exam: Ortho exam demonstrates full active and passive range of motion of the elbow and shoulder.  She has good rotator cuff strength on the right with infraspinatus supraspinatus and subscap muscle testing with no coarse grinding or crepitus with passive range of motion of that shoulder.  Elbow range of motion is full but she does have pain with resisted wrist extension and resisted finger extension.  Radial pulses intact.  Elbow range of motion otherwise full.  No other masses lymphadenopathy or skin changes noted in that elbow region.  Ulnar nerve does not subluxate.  Specialty Comments:  No specialty comments available.  Imaging: Xr Elbow Complete Right (3+view)  Result Date:  09/19/2018 AP lateral radial head right elbow reviewed.  No tendinitis or spurring is present.  Normal right elbow  Xr Shoulder Right  Result Date: 09/19/2018 AP axillary outlet right shoulder reviewed.  2 metallic anchors present in the humeral head.  No fracture dislocation is present.  No narrowing of the acromiohumeral distance.  No glenohumeral arthritis visualized.    PMFS History: Patient Active Problem List   Diagnosis Date Noted  . HYPERTENSION, BENIGN 03/08/2010  . COUGH 03/08/2010  . PULMONARY NODULE 10/03/2007   Past Medical History:  Diagnosis  Date  . Hypertension   . Pulmonary nodules     Family History  Problem Relation Age of Onset  . Breast cancer Sister     Past Surgical History:  Procedure Laterality Date  . ROTATOR CUFF REPAIR    . VAGINAL HYSTERECTOMY     Social History   Occupational History  . Not on file  Tobacco Use  . Smoking status: Never Smoker  Substance and Sexual Activity  . Alcohol use: No  . Drug use: No  . Sexual activity: Not on file

## 2018-09-28 DIAGNOSIS — E559 Vitamin D deficiency, unspecified: Secondary | ICD-10-CM | POA: Diagnosis not present

## 2018-09-28 DIAGNOSIS — I10 Essential (primary) hypertension: Secondary | ICD-10-CM | POA: Diagnosis not present

## 2018-09-28 DIAGNOSIS — Z8639 Personal history of other endocrine, nutritional and metabolic disease: Secondary | ICD-10-CM | POA: Diagnosis not present

## 2018-10-05 DIAGNOSIS — I10 Essential (primary) hypertension: Secondary | ICD-10-CM | POA: Diagnosis not present

## 2018-10-05 DIAGNOSIS — Z8639 Personal history of other endocrine, nutritional and metabolic disease: Secondary | ICD-10-CM | POA: Diagnosis not present

## 2018-10-05 DIAGNOSIS — E559 Vitamin D deficiency, unspecified: Secondary | ICD-10-CM | POA: Diagnosis not present

## 2018-10-09 ENCOUNTER — Ambulatory Visit
Admission: RE | Admit: 2018-10-09 | Discharge: 2018-10-09 | Disposition: A | Discharge status: Home / Self Care | Payer: Medicare HMO | Source: Ambulatory Visit | Attending: Internal Medicine | Admitting: Internal Medicine

## 2018-10-09 DIAGNOSIS — Z1231 Encounter for screening mammogram for malignant neoplasm of breast: Secondary | ICD-10-CM

## 2018-12-05 DIAGNOSIS — M25561 Pain in right knee: Secondary | ICD-10-CM | POA: Diagnosis not present

## 2018-12-05 DIAGNOSIS — M1711 Unilateral primary osteoarthritis, right knee: Secondary | ICD-10-CM | POA: Diagnosis not present

## 2018-12-05 DIAGNOSIS — M170 Bilateral primary osteoarthritis of knee: Secondary | ICD-10-CM | POA: Diagnosis not present

## 2019-01-01 DIAGNOSIS — R6889 Other general symptoms and signs: Secondary | ICD-10-CM | POA: Diagnosis not present

## 2019-01-01 DIAGNOSIS — J301 Allergic rhinitis due to pollen: Secondary | ICD-10-CM | POA: Diagnosis not present

## 2019-04-02 DIAGNOSIS — M7061 Trochanteric bursitis, right hip: Secondary | ICD-10-CM | POA: Diagnosis not present

## 2019-04-02 DIAGNOSIS — M25551 Pain in right hip: Secondary | ICD-10-CM | POA: Diagnosis not present

## 2019-04-03 DIAGNOSIS — I10 Essential (primary) hypertension: Secondary | ICD-10-CM | POA: Diagnosis not present

## 2019-04-03 DIAGNOSIS — E559 Vitamin D deficiency, unspecified: Secondary | ICD-10-CM | POA: Diagnosis not present

## 2019-04-03 DIAGNOSIS — R7309 Other abnormal glucose: Secondary | ICD-10-CM | POA: Diagnosis not present

## 2019-04-10 DIAGNOSIS — Z0000 Encounter for general adult medical examination without abnormal findings: Secondary | ICD-10-CM | POA: Diagnosis not present

## 2019-04-10 DIAGNOSIS — I10 Essential (primary) hypertension: Secondary | ICD-10-CM | POA: Diagnosis not present

## 2019-04-10 DIAGNOSIS — G43009 Migraine without aura, not intractable, without status migrainosus: Secondary | ICD-10-CM | POA: Diagnosis not present

## 2019-04-10 DIAGNOSIS — M62838 Other muscle spasm: Secondary | ICD-10-CM | POA: Diagnosis not present

## 2019-04-10 DIAGNOSIS — Z23 Encounter for immunization: Secondary | ICD-10-CM | POA: Diagnosis not present

## 2019-05-17 DIAGNOSIS — Z23 Encounter for immunization: Secondary | ICD-10-CM | POA: Diagnosis not present

## 2019-06-05 DIAGNOSIS — Z01419 Encounter for gynecological examination (general) (routine) without abnormal findings: Secondary | ICD-10-CM | POA: Diagnosis not present

## 2019-07-05 DIAGNOSIS — M25512 Pain in left shoulder: Secondary | ICD-10-CM | POA: Diagnosis not present

## 2019-07-05 DIAGNOSIS — I10 Essential (primary) hypertension: Secondary | ICD-10-CM | POA: Diagnosis not present

## 2019-09-11 ENCOUNTER — Other Ambulatory Visit: Payer: Self-pay | Admitting: Internal Medicine

## 2019-09-11 DIAGNOSIS — Z1231 Encounter for screening mammogram for malignant neoplasm of breast: Secondary | ICD-10-CM

## 2019-10-04 DIAGNOSIS — E559 Vitamin D deficiency, unspecified: Secondary | ICD-10-CM | POA: Diagnosis not present

## 2019-10-04 DIAGNOSIS — R7309 Other abnormal glucose: Secondary | ICD-10-CM | POA: Diagnosis not present

## 2019-10-04 DIAGNOSIS — I10 Essential (primary) hypertension: Secondary | ICD-10-CM | POA: Diagnosis not present

## 2019-10-08 DIAGNOSIS — M1711 Unilateral primary osteoarthritis, right knee: Secondary | ICD-10-CM | POA: Diagnosis not present

## 2019-10-08 DIAGNOSIS — M170 Bilateral primary osteoarthritis of knee: Secondary | ICD-10-CM | POA: Diagnosis not present

## 2019-10-08 DIAGNOSIS — M25561 Pain in right knee: Secondary | ICD-10-CM | POA: Diagnosis not present

## 2019-10-09 DIAGNOSIS — R7309 Other abnormal glucose: Secondary | ICD-10-CM | POA: Diagnosis not present

## 2019-10-09 DIAGNOSIS — E559 Vitamin D deficiency, unspecified: Secondary | ICD-10-CM | POA: Diagnosis not present

## 2019-10-09 DIAGNOSIS — I10 Essential (primary) hypertension: Secondary | ICD-10-CM | POA: Diagnosis not present

## 2019-10-21 ENCOUNTER — Ambulatory Visit: Payer: Medicare HMO

## 2019-10-29 DIAGNOSIS — R6889 Other general symptoms and signs: Secondary | ICD-10-CM | POA: Diagnosis not present

## 2019-11-18 ENCOUNTER — Ambulatory Visit
Admission: RE | Admit: 2019-11-18 | Discharge: 2019-11-18 | Disposition: A | Discharge status: Home / Self Care | Payer: Medicare HMO | Source: Ambulatory Visit | Attending: Internal Medicine | Admitting: Internal Medicine

## 2019-11-18 ENCOUNTER — Other Ambulatory Visit: Payer: Self-pay

## 2019-11-18 DIAGNOSIS — R6889 Other general symptoms and signs: Secondary | ICD-10-CM | POA: Diagnosis not present

## 2019-11-18 DIAGNOSIS — Z1231 Encounter for screening mammogram for malignant neoplasm of breast: Secondary | ICD-10-CM | POA: Diagnosis not present

## 2020-01-02 DIAGNOSIS — H1045 Other chronic allergic conjunctivitis: Secondary | ICD-10-CM | POA: Diagnosis not present

## 2020-01-02 DIAGNOSIS — R6889 Other general symptoms and signs: Secondary | ICD-10-CM | POA: Diagnosis not present

## 2020-01-02 DIAGNOSIS — J301 Allergic rhinitis due to pollen: Secondary | ICD-10-CM | POA: Diagnosis not present

## 2020-01-15 IMAGING — MG DIGITAL SCREENING BILATERAL MAMMOGRAM WITH TOMO AND CAD
8 series · 8 of 24 positions shown · non-contrast
Comparison: Previous exam(s).

CLINICAL DATA: Screening.

EXAM:
DIGITAL SCREENING BILATERAL MAMMOGRAM WITH TOMO AND CAD

[L CC synth-2D]
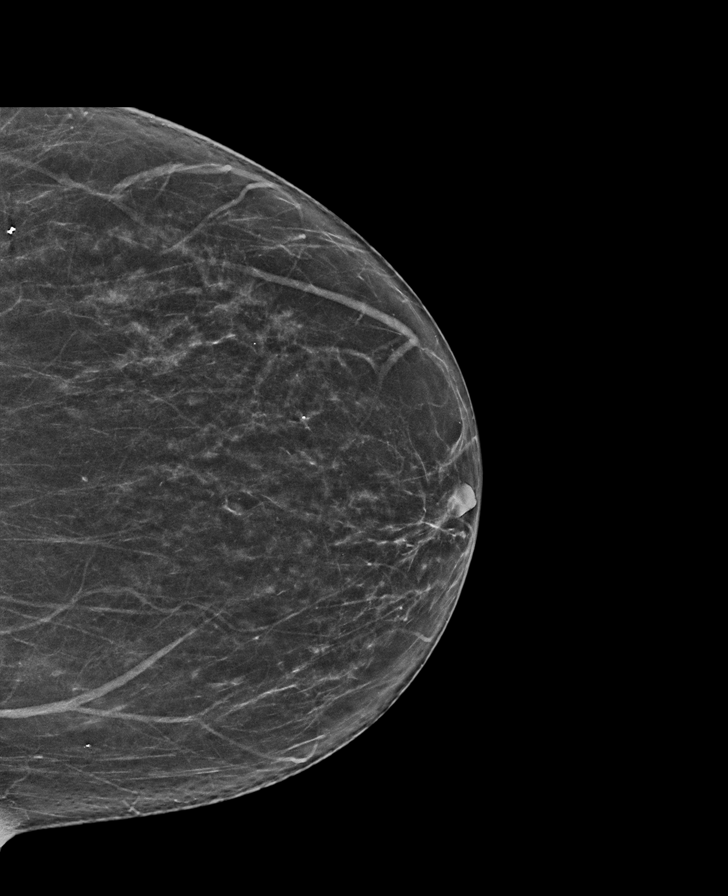

[R CC synth-2D]
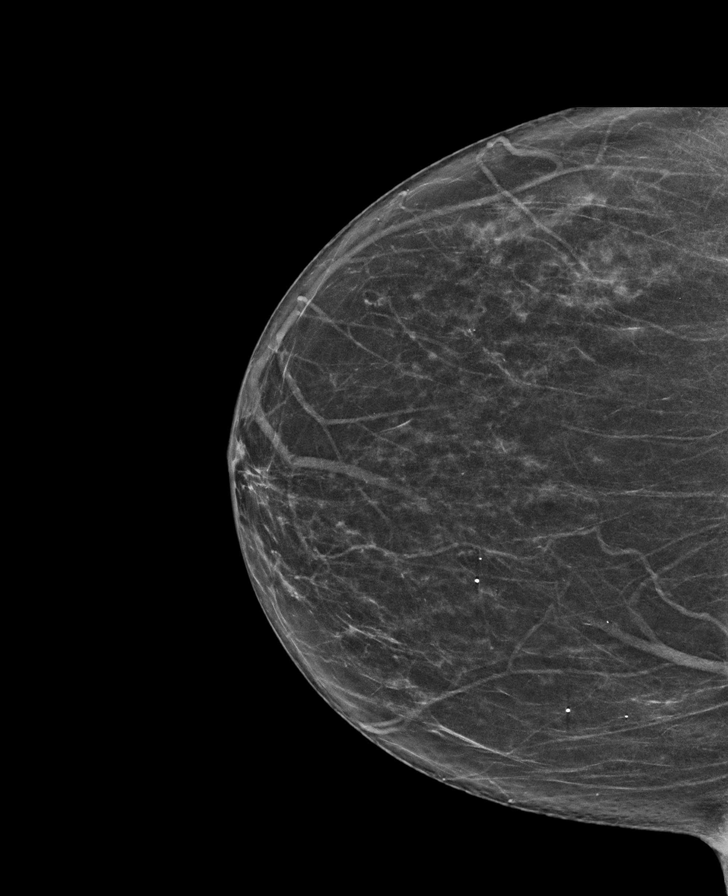

[R MLO synth-2D]
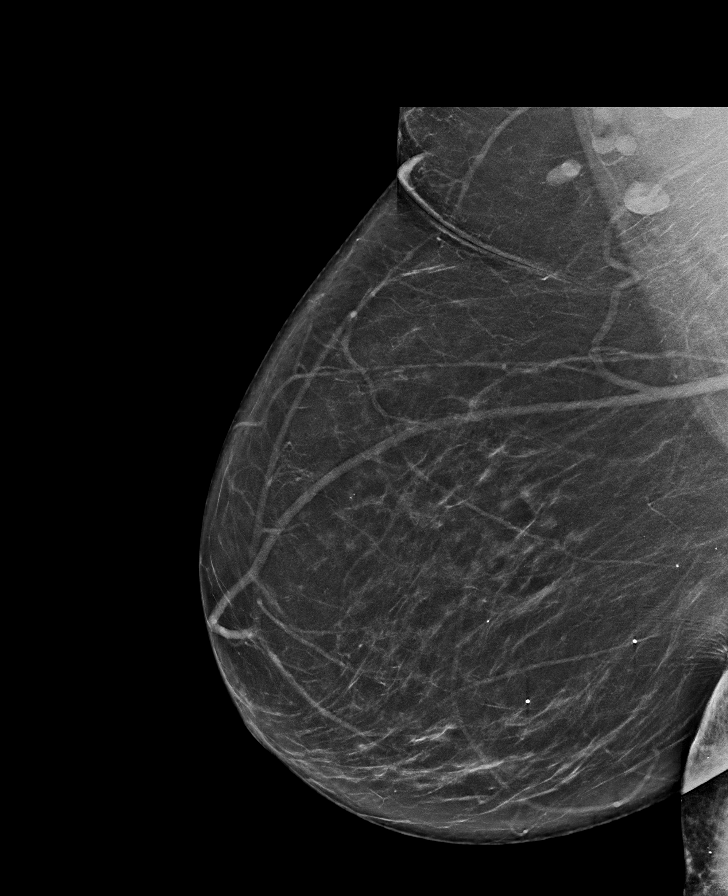

[L MLO synth-2D]
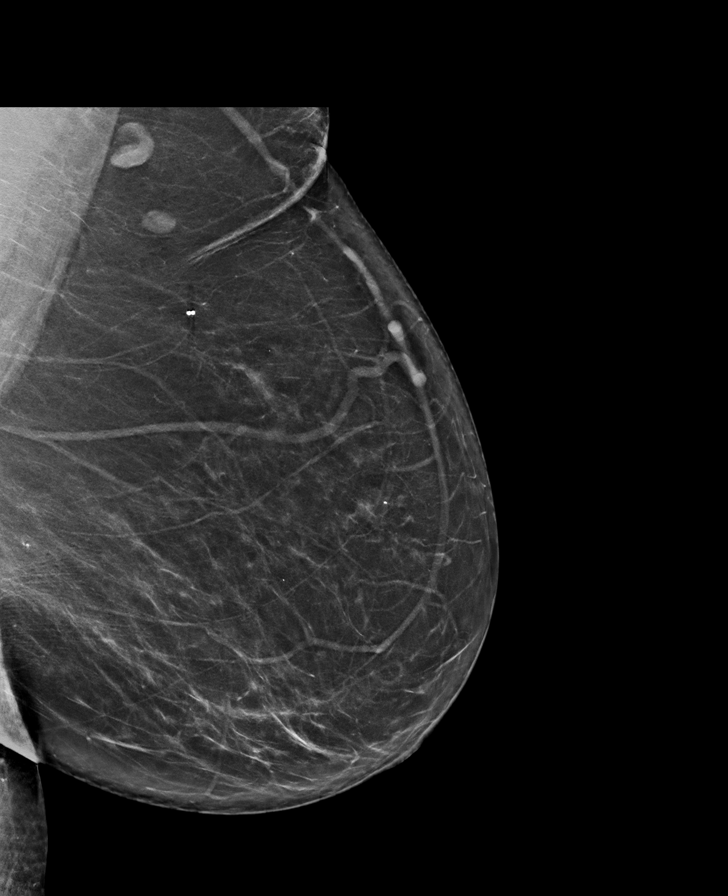

[R MLO tomo · tomo slice 41/81.0]
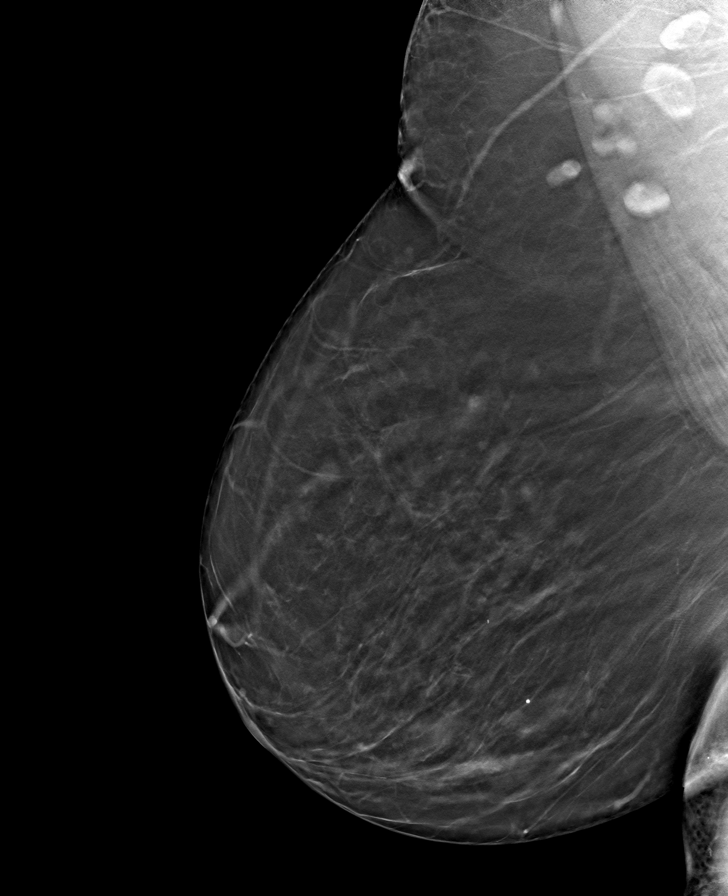

[R CC tomo · tomo slice 33/66.0]
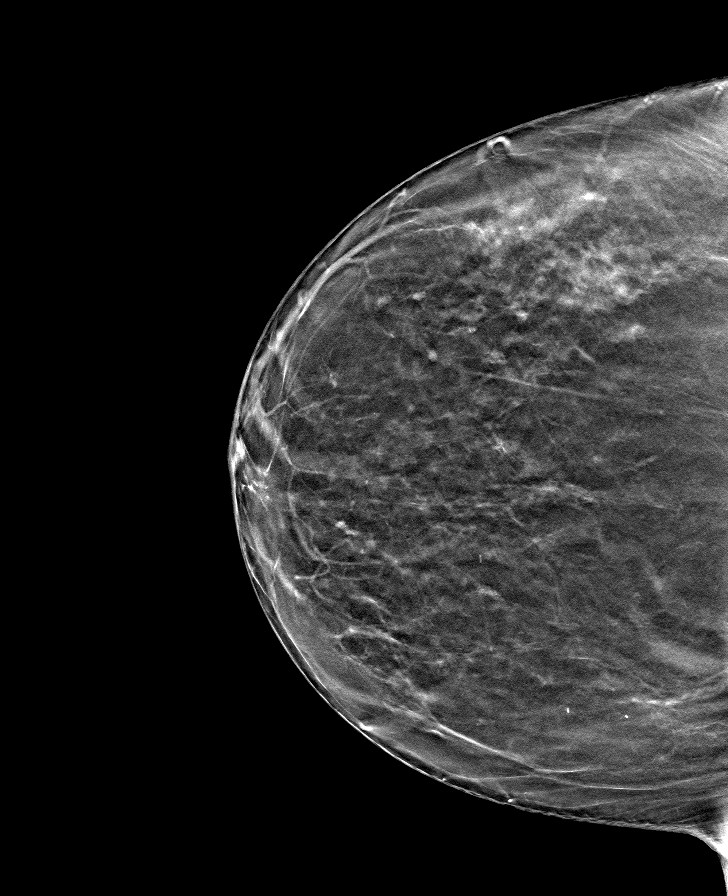

[L CC tomo · tomo slice 33/65.0]
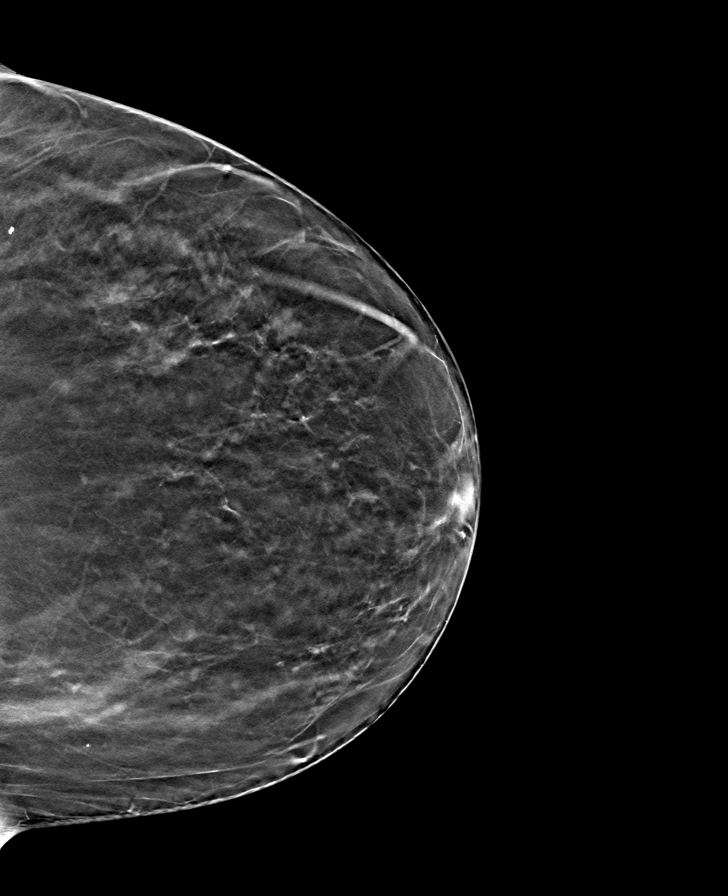

[L MLO tomo · tomo slice 41/81.0]
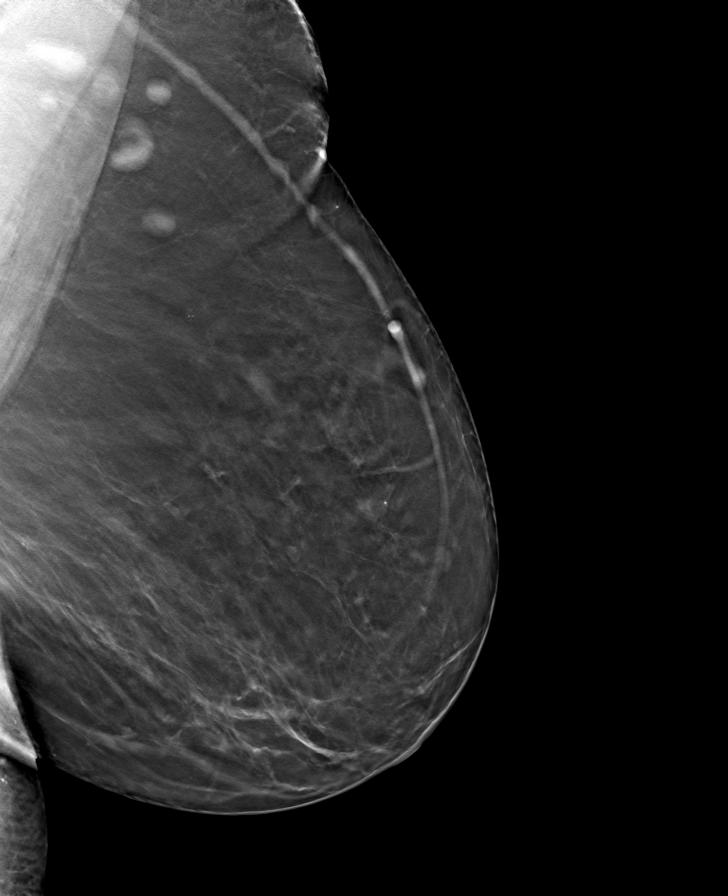

[8 of 24 positions shown; findings below may reference images not displayed]

ACR Breast Density Category b: There are scattered areas of
fibroglandular density.
FINDINGS: There are no findings suspicious for malignancy. Images were
processed with CAD.
IMPRESSION: No mammographic evidence of malignancy. A result letter of this
screening mammogram will be mailed directly to the patient.

RECOMMENDATION:
Screening mammogram in one year. (Code:CN-U-775)

BI-RADS CATEGORY  1: Negative.

## 2020-01-16 DIAGNOSIS — J301 Allergic rhinitis due to pollen: Secondary | ICD-10-CM | POA: Diagnosis not present

## 2020-05-06 DIAGNOSIS — Z6834 Body mass index (BMI) 34.0-34.9, adult: Secondary | ICD-10-CM | POA: Diagnosis not present

## 2020-05-06 DIAGNOSIS — E559 Vitamin D deficiency, unspecified: Secondary | ICD-10-CM | POA: Diagnosis not present

## 2020-05-06 DIAGNOSIS — R7309 Other abnormal glucose: Secondary | ICD-10-CM | POA: Diagnosis not present

## 2020-05-06 DIAGNOSIS — I10 Essential (primary) hypertension: Secondary | ICD-10-CM | POA: Diagnosis not present

## 2020-05-12 DIAGNOSIS — M7581 Other shoulder lesions, right shoulder: Secondary | ICD-10-CM | POA: Diagnosis not present

## 2020-05-12 DIAGNOSIS — M1990 Unspecified osteoarthritis, unspecified site: Secondary | ICD-10-CM | POA: Diagnosis not present

## 2020-05-12 DIAGNOSIS — M25511 Pain in right shoulder: Secondary | ICD-10-CM | POA: Diagnosis not present

## 2020-05-12 DIAGNOSIS — M25569 Pain in unspecified knee: Secondary | ICD-10-CM | POA: Diagnosis not present

## 2020-05-13 DIAGNOSIS — Z0000 Encounter for general adult medical examination without abnormal findings: Secondary | ICD-10-CM | POA: Diagnosis not present

## 2020-05-13 DIAGNOSIS — R7309 Other abnormal glucose: Secondary | ICD-10-CM | POA: Diagnosis not present

## 2020-05-13 DIAGNOSIS — I10 Essential (primary) hypertension: Secondary | ICD-10-CM | POA: Diagnosis not present

## 2020-05-13 DIAGNOSIS — E559 Vitamin D deficiency, unspecified: Secondary | ICD-10-CM | POA: Diagnosis not present

## 2020-06-16 DIAGNOSIS — Z23 Encounter for immunization: Secondary | ICD-10-CM | POA: Diagnosis not present

## 2020-07-22 DIAGNOSIS — Z01419 Encounter for gynecological examination (general) (routine) without abnormal findings: Secondary | ICD-10-CM | POA: Diagnosis not present

## 2020-07-24 ENCOUNTER — Ambulatory Visit: Payer: Medicare HMO | Attending: Internal Medicine

## 2020-07-24 DIAGNOSIS — Z23 Encounter for immunization: Secondary | ICD-10-CM

## 2020-07-24 NOTE — Progress Notes (Signed)
   Covid-19 Vaccination Clinic  Name:  ISIDRA MINGS    MRN: [id number] DOB: [birth year]  07/24/2020  Ms. Bullard-Bowen was observed post Covid-19 immunization for 15 minutes without incident. She was provided with Vaccine Information Sheet and instruction to access the V-Safe system.   Ms. Ferrick was instructed to call 911 with any severe reactions post vaccine: Marland Kitchen Difficulty breathing  . Swelling of face and throat  . A fast heartbeat  . A bad rash all over body  . Dizziness and weakness

## 2020-08-03 DIAGNOSIS — M25569 Pain in unspecified knee: Secondary | ICD-10-CM | POA: Diagnosis not present

## 2020-08-03 DIAGNOSIS — M7581 Other shoulder lesions, right shoulder: Secondary | ICD-10-CM | POA: Diagnosis not present

## 2020-08-03 DIAGNOSIS — M25561 Pain in right knee: Secondary | ICD-10-CM | POA: Diagnosis not present

## 2020-08-03 DIAGNOSIS — M1990 Unspecified osteoarthritis, unspecified site: Secondary | ICD-10-CM | POA: Diagnosis not present

## 2020-10-26 ENCOUNTER — Other Ambulatory Visit: Payer: Self-pay | Admitting: Internal Medicine

## 2020-10-26 DIAGNOSIS — Z0000 Encounter for general adult medical examination without abnormal findings: Secondary | ICD-10-CM

## 2020-11-19 DIAGNOSIS — M1990 Unspecified osteoarthritis, unspecified site: Secondary | ICD-10-CM | POA: Diagnosis not present

## 2020-11-19 DIAGNOSIS — M25461 Effusion, right knee: Secondary | ICD-10-CM | POA: Diagnosis not present

## 2020-11-19 DIAGNOSIS — M25562 Pain in left knee: Secondary | ICD-10-CM | POA: Diagnosis not present

## 2020-11-19 DIAGNOSIS — M1712 Unilateral primary osteoarthritis, left knee: Secondary | ICD-10-CM | POA: Diagnosis not present

## 2020-11-19 DIAGNOSIS — M25561 Pain in right knee: Secondary | ICD-10-CM | POA: Diagnosis not present

## 2020-11-19 DIAGNOSIS — M7581 Other shoulder lesions, right shoulder: Secondary | ICD-10-CM | POA: Diagnosis not present

## 2020-11-19 DIAGNOSIS — M1711 Unilateral primary osteoarthritis, right knee: Secondary | ICD-10-CM | POA: Diagnosis not present

## 2020-11-19 DIAGNOSIS — M25569 Pain in unspecified knee: Secondary | ICD-10-CM | POA: Diagnosis not present

## 2020-12-14 ENCOUNTER — Inpatient Hospital Stay: Admission: RE | Admit: 2020-12-14 | Payer: Medicare HMO | Source: Ambulatory Visit

## 2021-02-02 ENCOUNTER — Other Ambulatory Visit: Payer: Self-pay

## 2021-02-02 ENCOUNTER — Ambulatory Visit
Admission: RE | Admit: 2021-02-02 | Discharge: 2021-02-02 | Disposition: A | Discharge status: Home / Self Care | Payer: Medicare HMO | Source: Ambulatory Visit | Attending: Internal Medicine | Admitting: Internal Medicine

## 2021-02-02 DIAGNOSIS — Z0000 Encounter for general adult medical examination without abnormal findings: Secondary | ICD-10-CM

## 2021-02-02 DIAGNOSIS — Z1231 Encounter for screening mammogram for malignant neoplasm of breast: Secondary | ICD-10-CM | POA: Diagnosis not present

## 2021-03-11 DIAGNOSIS — M1990 Unspecified osteoarthritis, unspecified site: Secondary | ICD-10-CM | POA: Diagnosis not present

## 2021-03-11 DIAGNOSIS — I10 Essential (primary) hypertension: Secondary | ICD-10-CM | POA: Diagnosis not present

## 2021-03-11 DIAGNOSIS — M1711 Unilateral primary osteoarthritis, right knee: Secondary | ICD-10-CM | POA: Diagnosis not present

## 2021-04-14 ENCOUNTER — Encounter: Payer: Self-pay | Admitting: Gastroenterology

## 2021-05-12 DIAGNOSIS — M1990 Unspecified osteoarthritis, unspecified site: Secondary | ICD-10-CM | POA: Diagnosis not present

## 2021-05-12 DIAGNOSIS — I10 Essential (primary) hypertension: Secondary | ICD-10-CM | POA: Diagnosis not present

## 2021-05-12 DIAGNOSIS — M1711 Unilateral primary osteoarthritis, right knee: Secondary | ICD-10-CM | POA: Diagnosis not present

## 2021-05-31 ENCOUNTER — Encounter: Payer: Self-pay | Admitting: Gastroenterology

## 2021-05-31 ENCOUNTER — Ambulatory Visit (AMBULATORY_SURGERY_CENTER): Payer: Medicare HMO | Admitting: *Deleted

## 2021-05-31 ENCOUNTER — Other Ambulatory Visit: Payer: Self-pay

## 2021-05-31 VITALS — Ht 62.0 in | Wt 170.0 lb

## 2021-05-31 DIAGNOSIS — Z1211 Encounter for screening for malignant neoplasm of colon: Secondary | ICD-10-CM

## 2021-05-31 MED ORDER — NA SULFATE-K SULFATE-MG SULF 17.5-3.13-1.6 GM/177ML PO SOLN: 1.0000 | ORAL | 0 refills | Status: DC

## 2021-05-31 NOTE — Progress Notes (Signed)
Patient's pre-visit was done today over the phone with the patient due to COVID-19 pandemic. Name,DOB and address verified. Patient denies any allergies to Eggs and Soy. Patient denies any problems with anesthesia/sedation. Patient is not taking any diet pills or blood thinners. No home Oxygen. Packet of Prep instructions mailed to patient including a copy of a consent form-pt is aware. Patient understands to call us back with any questions or concerns. Patient is aware of our care-partner policy and [id number] safety protocol.   The patient is COVID-19 vaccinated.

## 2021-06-01 ENCOUNTER — Encounter: Payer: Self-pay | Admitting: Gastroenterology

## 2021-06-15 ENCOUNTER — Ambulatory Visit (AMBULATORY_SURGERY_CENTER): Payer: Medicare HMO | Admitting: Gastroenterology

## 2021-06-15 ENCOUNTER — Encounter: Payer: Self-pay | Admitting: Gastroenterology

## 2021-06-15 ENCOUNTER — Other Ambulatory Visit: Payer: Self-pay

## 2021-06-15 VITALS — BP 141/62 | HR 64 | Temp 98.6°F | Resp 14 | Ht 62.25 in | Wt 170.0 lb

## 2021-06-15 DIAGNOSIS — D120 Benign neoplasm of cecum: Secondary | ICD-10-CM | POA: Diagnosis not present

## 2021-06-15 DIAGNOSIS — D124 Benign neoplasm of descending colon: Secondary | ICD-10-CM

## 2021-06-15 DIAGNOSIS — Z1211 Encounter for screening for malignant neoplasm of colon: Secondary | ICD-10-CM | POA: Diagnosis not present

## 2021-06-15 MED ORDER — SODIUM CHLORIDE 0.9 % IV SOLN: 500.0000 mL | Freq: Once | INTRAVENOUS | Status: DC

## 2021-06-15 NOTE — Progress Notes (Signed)
GASTROENTEROLOGY PROCEDURE H&P NOTE   Primary Care Physician: Deland Pretty, MD  HPI: Kristen Oliver is a 70 y.o. female who presents for Colonoscopy for screening.  Past Medical History:  Diagnosis Date   Allergy    Anemia    Arthritis    knee   Hypertension    Pulmonary nodules    Past Surgical History:  Procedure Laterality Date   COLONOSCOPY  2012   Dr.Mann   COLONOSCOPY  2007   Dr.Orr--polyp removed   KNEE ARTHROSCOPY Bilateral    ROTATOR CUFF REPAIR     VAGINAL HYSTERECTOMY     Current Outpatient Medications  Medication Sig Dispense Refill   fexofenadine (ALLEGRA) 60 MG tablet      Ibuprofen (ADVIL PO) Take by mouth as needed.     losartan-hydrochlorothiazide (HYZAAR) 100-25 MG per tablet Take 1 tablet by mouth daily.     acetaminophen (TYLENOL) 500 MG tablet Take 500 mg by mouth every 6 (six) hours as needed.     ipratropium (ATROVENT) 0.06 % nasal spray 2 drops in each nostril as needed     loratadine (CLARITIN) 10 MG tablet 1 tablet     LORazepam (ATIVAN) 0.5 MG tablet Take 0.5 mg by mouth every 8 (eight) hours as needed. For anxiety     potassium chloride SA (KLOR-CON) 20 MEQ tablet 1 tablet     rizatriptan (MAXALT-MLT) 10 MG disintegrating tablet Take 10 mg by mouth as needed. For migraines. May repeat in 2 hours if needed     Vitamin D, Ergocalciferol, (DRISDOL) 50000 UNITS CAPS Take 50,000 Units by mouth every 7 (seven) days. Monday     Current Facility-Administered Medications  Medication Dose Route Frequency Provider Last Rate Last Admin   0.9 %  sodium chloride infusion  500 mL Intravenous Once Mansouraty, Telford Nab., MD        Current Outpatient Medications:    fexofenadine (ALLEGRA) 60 MG tablet, , Disp: , Rfl:    Ibuprofen (ADVIL PO), Take by mouth as needed., Disp: , Rfl:    losartan-hydrochlorothiazide (HYZAAR) 100-25 MG per tablet, Take 1 tablet by mouth daily., Disp: , Rfl:    acetaminophen (TYLENOL) 500 MG tablet, Take 500 mg by  mouth every 6 (six) hours as needed., Disp: , Rfl:    ipratropium (ATROVENT) 0.06 % nasal spray, 2 drops in each nostril as needed, Disp: , Rfl:    loratadine (CLARITIN) 10 MG tablet, 1 tablet, Disp: , Rfl:    LORazepam (ATIVAN) 0.5 MG tablet, Take 0.5 mg by mouth every 8 (eight) hours as needed. For anxiety, Disp: , Rfl:    potassium chloride SA (KLOR-CON) 20 MEQ tablet, 1 tablet, Disp: , Rfl:    rizatriptan (MAXALT-MLT) 10 MG disintegrating tablet, Take 10 mg by mouth as needed. For migraines. May repeat in 2 hours if needed, Disp: , Rfl:    Vitamin D, Ergocalciferol, (DRISDOL) 50000 UNITS CAPS, Take 50,000 Units by mouth every 7 (seven) days. Monday, Disp: , Rfl:   Current Facility-Administered Medications:    0.9 %  sodium chloride infusion, 500 mL, Intravenous, Once, Mansouraty, Telford Nab., MD Allergies  Allergen Reactions   Tramadol Hcl Other (See Comments)    Did not like the way it makes me feel   Family History  Problem Relation Age of Onset   Breast cancer Sister    Colon cancer Neg Hx    Colon polyps Neg Hx    Esophageal cancer Neg Hx    Rectal cancer  Neg Hx    Stomach cancer Neg Hx    Social History   Socioeconomic History   Marital status: Married    Spouse name: Not on file   Number of children: Not on file   Years of education: Not on file   Highest education level: Not on file  Occupational History   Not on file  Tobacco Use   Smoking status: Never   Smokeless tobacco: Never  Vaping Use   Vaping Use: Never used  Substance and Sexual Activity   Alcohol use: No   Drug use: No   Sexual activity: Not on file  Other Topics Concern   Not on file  Social History Narrative   Not on file   Social Determinants of Health   Financial Resource Strain: Not on file  Food Insecurity: Not on file  Transportation Needs: Not on file  Physical Activity: Not on file  Stress: Not on file  Social Connections: Not on file  Intimate Partner Violence: Not on file     Physical Exam: Today's Vitals   06/15/21 0842  BP: (!) 146/89  Pulse: 77  Temp: 98.6 F (37 C)  TempSrc: Temporal  SpO2: 99%  Weight: 170 lb (77.1 kg)  Height: 5' 2.25" (1.581 m)   Body mass index is 30.84 kg/m. GEN: NAD EYE: Sclerae anicteric ENT: MMM CV: Non-tachycardic GI: Soft, NT/ND NEURO:  Alert & Oriented x 3  Lab Results: No results for input(s): WBC, HGB, HCT, PLT in the last 72 hours. BMET No results for input(s): NA, K, CL, CO2, GLUCOSE, BUN, CREATININE, CALCIUM in the last 72 hours. LFT No results for input(s): PROT, ALBUMIN, AST, ALT, ALKPHOS, BILITOT, BILIDIR, IBILI in the last 72 hours. PT/INR No results for input(s): LABPROT, INR in the last 72 hours.   Impression / Plan: This is a 70 y.o.female who presents for Colonoscopy for screening.  The risks and benefits of endoscopic evaluation/treatment were discussed with the patient and/or family; these include but are not limited to the risk of perforation, infection, bleeding, missed lesions, lack of diagnosis, severe illness requiring hospitalization, as well as anesthesia and sedation related illnesses.  The patient's history has been reviewed, patient examined, no change in status, and deemed stable for procedure.  The patient and/or family is agreeable to proceed.    Justice Britain, MD [facility] Office # [phone]

## 2021-06-15 NOTE — Progress Notes (Signed)
Sedate, gd SR, tolerated procedure well, VSS, report to RN 

## 2021-06-15 NOTE — Progress Notes (Signed)
Called to room to assist during endoscopic procedure.  Patient ID and intended procedure confirmed with present staff. Received instructions for my participation in the procedure from the performing physician.  

## 2021-06-15 NOTE — Progress Notes (Signed)
VS- Kristen Oliver  Pt's states no medical or surgical changes since previsit or office visit.  

## 2021-06-15 NOTE — Op Note (Signed)
[facility] Patient Name: Kristen Oliver Procedure Date: 06/15/2021 9:48 AM MRN: [id number] Endoscopist: Justice Britain , MD Age: 70 Referring MD:  Date of Birth: Apr 09, 1951 Gender: Female Account #: [id number] Procedure:                Colonoscopy Indications:              Screening for colorectal malignant neoplasm Medicines:                Monitored Anesthesia Care Procedure:                Pre-Anesthesia Assessment:                           - Prior to the procedure, a History and Physical                            was performed, and patient medications and                            allergies were reviewed. The patient's tolerance of                            previous anesthesia was also reviewed. The risks                            and benefits of the procedure and the sedation                            options and risks were discussed with the patient.                            All questions were answered, and informed consent                            was obtained. Prior Anticoagulants: The patient has                            taken no previous anticoagulant or antiplatelet                            agents except for NSAID medication. ASA Grade                            Assessment: II - A patient with mild systemic                            disease. After reviewing the risks and benefits,                            the patient was deemed in satisfactory condition to                            undergo the procedure.  After obtaining informed consent, the colonoscope                            was passed under direct vision. Throughout the                            procedure, the patient's blood pressure, pulse, and                            oxygen saturations were monitored continuously. The                            CF [id number] #[id number] was introduced through the anus                            and advanced to the 5 cm  into the ileum. The                            colonoscopy was performed without difficulty. The                            patient tolerated the procedure. The quality of the                            bowel preparation was adequate. The terminal ileum,                            ileocecal valve, appendiceal orifice, and rectum                            were photographed. Scope In: 9:56:26 AM Scope Out: 10:09:02 AM Scope Withdrawal Time: 0 hours 9 minutes 4 seconds  Total Procedure Duration: 0 hours 12 minutes 36 seconds  Findings:                 The digital rectal exam was normal. Pertinent                            negatives include no palpable rectal lesions.                           The terminal ileum and ileocecal valve appeared                            normal.                           Two sessile polyps were found in the descending                            colon and cecum. The polyps were 3 to 4 mm in size.                            These polyps were removed with a cold snare.  Resection and retrieval were complete.                           Multiple small-mouthed diverticula were found in                            the recto-sigmoid colon, sigmoid colon and                            descending colon.                           Normal mucosa was found in the entire colon                            otherwise.                           Non-bleeding non-thrombosed internal hemorrhoids                            were found during retroflexion, during perianal                            exam and during digital exam. The hemorrhoids were                            Grade I. Complications:            No immediate complications. Estimated Blood Loss:     Estimated blood loss was minimal. Impression:               - The examined portion of the ileum was normal.                           - Two 3 to 4 mm polyps in the descending colon and                             in the cecum, removed with a cold snare. Resected                            and retrieved.                           - Diverticulosis in the recto-sigmoid colon, in the                            sigmoid colon and in the descending colon.                           - Normal mucosa in the entire examined colon                            otherwise.                           -  Non-bleeding non-thrombosed internal hemorrhoids. Recommendation:           - The patient will be observed post-procedure,                            until all discharge criteria are met.                           - Discharge patient to home.                           - Patient has a contact number available for                            emergencies. The signs and symptoms of potential                            delayed complications were discussed with the                            patient. Return to normal activities tomorrow.                            Written discharge instructions were provided to the                            patient.                           - High fiber diet.                           - Use FiberCon 1-2 tablets PO daily.                           - Continue present medications.                           - Await pathology results.                           - Repeat colonoscopy in 01/16/09 years for                            surveillance based on pathology results and                            findings of adenomatous tissue.                           - The findings and recommendations were discussed                            with the patient.                           - The findings and recommendations were discussed  with the patient's family. Justice Britain, MD 06/15/2021 10:18:48 AM

## 2021-06-15 NOTE — Patient Instructions (Signed)
Handouts given for polyps, diverticulosis, hemorrhoids and high fiber diet. Use FiberCon tablets 1-2 per day and drink plenty of water with it. Await pathology results. Resume previous diet and medications.   YOU HAD AN ENDOSCOPIC PROCEDURE TODAY AT [facility]:   Refer to the procedure report that was given to you for any specific questions about what was found during the examination.  If the procedure report does not answer your questions, please call your gastroenterologist to clarify.  If you requested that your care partner not be given the details of your procedure findings, then the procedure report has been included in a sealed envelope for you to review at your convenience later.  YOU SHOULD EXPECT: Some feelings of bloating in the abdomen. Passage of more gas than usual.  Walking can help get rid of the air that was put into your GI tract during the procedure and reduce the bloating. If you had a lower endoscopy (such as a colonoscopy or flexible sigmoidoscopy) you may notice spotting of blood in your stool or on the toilet paper. If you underwent a bowel prep for your procedure, you may not have a normal bowel movement for a few days.  Please Note:  You might notice some irritation and congestion in your nose or some drainage.  This is from the oxygen used during your procedure.  There is no need for concern and it should clear up in a day or so.  SYMPTOMS TO REPORT IMMEDIATELY:  Following lower endoscopy (colonoscopy or flexible sigmoidoscopy):  Excessive amounts of blood in the stool  Significant tenderness or worsening of abdominal pains  Swelling of the abdomen that is new, acute  Fever of 100F or higher  For urgent or emergent issues, a gastroenterologist can be reached at any hour by calling [phone]. Do not use MyChart messaging for urgent concerns.    DIET:  We do recommend a small meal at first, but then you may proceed to your regular diet.  Drink  plenty of fluids but you should avoid alcoholic beverages for 24 hours.  ACTIVITY:  You should plan to take it easy for the rest of today and you should NOT DRIVE or use heavy machinery until tomorrow (because of the sedation medicines used during the test).    FOLLOW UP: Our staff will call the number listed on your records 48-72 hours following your procedure to check on you and address any questions or concerns that you may have regarding the information given to you following your procedure. If we do not reach you, we will leave a message.  We will attempt to reach you two times.  During this call, we will ask if you have developed any symptoms of COVID 19. If you develop any symptoms (ie: fever, flu-like symptoms, shortness of breath, cough etc.) before then, please call [phone].  If you test positive for Covid 19 in the 2 weeks post procedure, please call and report this information to [place].    If any biopsies were taken you will be contacted by phone or by letter within the next 1-3 weeks.  Please call us at [phone] if you have not heard about the biopsies in 3 weeks.    SIGNATURES/CONFIDENTIALITY: You and/or your care partner have signed paperwork which will be entered into your electronic medical record.  These signatures attest to the fact that that the information above on your After Visit Summary has been reviewed and is understood.  Full responsibility  of the confidentiality of this discharge information lies with you and/or your care-partner.

## 2021-06-17 ENCOUNTER — Telehealth: Payer: Self-pay

## 2021-06-17 ENCOUNTER — Encounter: Payer: Self-pay | Admitting: Gastroenterology

## 2021-06-17 NOTE — Telephone Encounter (Signed)
Left message on follow up call. 

## 2021-06-17 NOTE — Telephone Encounter (Signed)
  Follow up Call-  Call back number 06/15/2021  Post procedure Call Back phone  # [phone]  Permission to leave phone message Yes  Some recent data might be hidden     Patient questions:  Do you have a fever, pain , or abdominal swelling? No. Pain Score  0 *  Have you tolerated food without any problems? Yes.    Have you been able to return to your normal activities? Yes.    Do you have any questions about your discharge instructions: Diet   No. Medications  No. Follow up visit  No.  Do you have questions or concerns about your Care? No.  Actions: * If pain score is 4 or above: No action needed, pain <4.

## 2021-06-23 DIAGNOSIS — Z0000 Encounter for general adult medical examination without abnormal findings: Secondary | ICD-10-CM | POA: Diagnosis not present

## 2021-06-23 DIAGNOSIS — I10 Essential (primary) hypertension: Secondary | ICD-10-CM | POA: Diagnosis not present

## 2021-06-23 DIAGNOSIS — R7309 Other abnormal glucose: Secondary | ICD-10-CM | POA: Diagnosis not present

## 2021-06-30 DIAGNOSIS — E876 Hypokalemia: Secondary | ICD-10-CM | POA: Diagnosis not present

## 2021-06-30 DIAGNOSIS — Z0000 Encounter for general adult medical examination without abnormal findings: Secondary | ICD-10-CM | POA: Diagnosis not present

## 2021-06-30 DIAGNOSIS — E559 Vitamin D deficiency, unspecified: Secondary | ICD-10-CM | POA: Diagnosis not present

## 2021-06-30 DIAGNOSIS — Z23 Encounter for immunization: Secondary | ICD-10-CM | POA: Diagnosis not present

## 2021-06-30 DIAGNOSIS — I10 Essential (primary) hypertension: Secondary | ICD-10-CM | POA: Diagnosis not present

## 2021-09-21 DIAGNOSIS — R300 Dysuria: Secondary | ICD-10-CM | POA: Diagnosis not present

## 2021-12-03 DIAGNOSIS — M1711 Unilateral primary osteoarthritis, right knee: Secondary | ICD-10-CM | POA: Diagnosis not present

## 2021-12-03 DIAGNOSIS — M25561 Pain in right knee: Secondary | ICD-10-CM | POA: Diagnosis not present

## 2021-12-29 ENCOUNTER — Other Ambulatory Visit: Payer: Self-pay | Admitting: Internal Medicine

## 2021-12-29 DIAGNOSIS — Z1231 Encounter for screening mammogram for malignant neoplasm of breast: Secondary | ICD-10-CM

## 2022-01-31 DIAGNOSIS — J3089 Other allergic rhinitis: Secondary | ICD-10-CM | POA: Diagnosis not present

## 2022-02-03 ENCOUNTER — Ambulatory Visit: Payer: Medicare HMO

## 2022-02-10 ENCOUNTER — Ambulatory Visit
Admission: RE | Admit: 2022-02-10 | Discharge: 2022-02-10 | Disposition: A | Discharge status: Home / Self Care | Payer: Medicare Other | Source: Ambulatory Visit | Attending: Internal Medicine | Admitting: Internal Medicine

## 2022-02-10 DIAGNOSIS — Z1231 Encounter for screening mammogram for malignant neoplasm of breast: Secondary | ICD-10-CM | POA: Diagnosis not present

## 2022-04-26 DIAGNOSIS — M1120 Other chondrocalcinosis, unspecified site: Secondary | ICD-10-CM | POA: Diagnosis not present

## 2022-04-26 DIAGNOSIS — M25461 Effusion, right knee: Secondary | ICD-10-CM | POA: Diagnosis not present

## 2022-04-26 DIAGNOSIS — M25569 Pain in unspecified knee: Secondary | ICD-10-CM | POA: Diagnosis not present

## 2022-04-26 DIAGNOSIS — M25561 Pain in right knee: Secondary | ICD-10-CM | POA: Diagnosis not present

## 2022-04-26 DIAGNOSIS — M1990 Unspecified osteoarthritis, unspecified site: Secondary | ICD-10-CM | POA: Diagnosis not present

## 2022-06-14 DIAGNOSIS — H1045 Other chronic allergic conjunctivitis: Secondary | ICD-10-CM | POA: Diagnosis not present

## 2022-06-14 DIAGNOSIS — J301 Allergic rhinitis due to pollen: Secondary | ICD-10-CM | POA: Diagnosis not present

## 2022-06-24 DIAGNOSIS — Z23 Encounter for immunization: Secondary | ICD-10-CM | POA: Diagnosis not present

## 2022-07-14 DIAGNOSIS — E7800 Pure hypercholesterolemia, unspecified: Secondary | ICD-10-CM | POA: Diagnosis not present

## 2022-07-14 DIAGNOSIS — I10 Essential (primary) hypertension: Secondary | ICD-10-CM | POA: Diagnosis not present

## 2022-07-14 DIAGNOSIS — R7309 Other abnormal glucose: Secondary | ICD-10-CM | POA: Diagnosis not present

## 2022-07-14 DIAGNOSIS — Z0000 Encounter for general adult medical examination without abnormal findings: Secondary | ICD-10-CM | POA: Diagnosis not present

## 2022-07-14 DIAGNOSIS — E876 Hypokalemia: Secondary | ICD-10-CM | POA: Diagnosis not present

## 2022-07-14 DIAGNOSIS — E559 Vitamin D deficiency, unspecified: Secondary | ICD-10-CM | POA: Diagnosis not present

## 2022-07-21 DIAGNOSIS — E876 Hypokalemia: Secondary | ICD-10-CM | POA: Diagnosis not present

## 2022-07-21 DIAGNOSIS — I10 Essential (primary) hypertension: Secondary | ICD-10-CM | POA: Diagnosis not present

## 2022-07-21 DIAGNOSIS — Z0000 Encounter for general adult medical examination without abnormal findings: Secondary | ICD-10-CM | POA: Diagnosis not present

## 2022-07-21 DIAGNOSIS — E559 Vitamin D deficiency, unspecified: Secondary | ICD-10-CM | POA: Diagnosis not present

## 2022-07-28 DIAGNOSIS — I10 Essential (primary) hypertension: Secondary | ICD-10-CM | POA: Diagnosis not present

## 2022-11-01 ENCOUNTER — Telehealth: Payer: Self-pay | Admitting: *Deleted

## 2022-11-01 NOTE — Patient Outreach (Signed)
  Care Coordination   11/01/2022 Name: Kristen Oliver MRN: [id number] DOB: [birth year]   Care Coordination Outreach Attempts:  An unsuccessful telephone outreach was attempted today to offer the patient information about available care coordination services as a benefit of their health plan.   Follow Up Plan:  Additional outreach attempts will be made to offer the patient care coordination information and services.   Encounter Outcome:  No Answer   Care Coordination Interventions:  No, not indicated    Eduard Clos, MSW, [place] Worker [facility] [phone]

## 2022-12-01 ENCOUNTER — Telehealth: Payer: Self-pay | Admitting: *Deleted

## 2022-12-01 NOTE — Patient Outreach (Signed)
  Care Coordination   12/01/2022 Name: Kristen Oliver MRN: [id number] DOB: 11-28-1950   Care Coordination Outreach Attempts:  An unsuccessful telephone outreach was attempted today to offer the patient information about available care coordination services as a benefit of their health plan.   Follow Up Plan:  Additional outreach attempts will be made to offer the patient care coordination information and services.   Encounter Outcome:  No Answer   Care Coordination Interventions:  No, not indicated    Eduard Clos, MSW, [place] Worker [facility] [phone]

## 2022-12-01 NOTE — Patient Outreach (Signed)
  Care Coordination   12/01/2022 Name: Kristen Oliver MRN: [id number] DOB: 06/18/1951   Care Coordination Outreach Attempts:  An unsuccessful telephone outreach was attempted today to offer the patient information about available care coordination services as a benefit of their health plan.   Follow Up Plan:  Additional outreach attempts will be made to offer the patient care coordination information and services.   Encounter Outcome:  No Answer   Care Coordination Interventions:  No, not indicated    Eduard Clos, MSW, [place] Worker [facility] [phone]

## 2022-12-22 DIAGNOSIS — J309 Allergic rhinitis, unspecified: Secondary | ICD-10-CM | POA: Diagnosis not present

## 2023-01-12 DIAGNOSIS — I10 Essential (primary) hypertension: Secondary | ICD-10-CM | POA: Diagnosis not present

## 2023-01-12 DIAGNOSIS — E559 Vitamin D deficiency, unspecified: Secondary | ICD-10-CM | POA: Diagnosis not present

## 2023-01-12 DIAGNOSIS — Z0000 Encounter for general adult medical examination without abnormal findings: Secondary | ICD-10-CM | POA: Diagnosis not present

## 2023-01-12 DIAGNOSIS — E876 Hypokalemia: Secondary | ICD-10-CM | POA: Diagnosis not present

## 2023-01-12 DIAGNOSIS — R7309 Other abnormal glucose: Secondary | ICD-10-CM | POA: Diagnosis not present

## 2023-01-19 DIAGNOSIS — J309 Allergic rhinitis, unspecified: Secondary | ICD-10-CM | POA: Diagnosis not present

## 2023-01-19 DIAGNOSIS — I10 Essential (primary) hypertension: Secondary | ICD-10-CM | POA: Diagnosis not present

## 2023-01-19 DIAGNOSIS — E559 Vitamin D deficiency, unspecified: Secondary | ICD-10-CM | POA: Diagnosis not present

## 2023-01-25 ENCOUNTER — Other Ambulatory Visit: Payer: Self-pay | Admitting: Internal Medicine

## 2023-01-25 DIAGNOSIS — Z1231 Encounter for screening mammogram for malignant neoplasm of breast: Secondary | ICD-10-CM

## 2023-02-14 ENCOUNTER — Ambulatory Visit
Admission: RE | Admit: 2023-02-14 | Discharge: 2023-02-14 | Disposition: A | Discharge status: Home / Self Care | Payer: 59 | Source: Ambulatory Visit | Attending: Internal Medicine | Admitting: Internal Medicine

## 2023-02-14 DIAGNOSIS — Z1231 Encounter for screening mammogram for malignant neoplasm of breast: Secondary | ICD-10-CM | POA: Diagnosis not present

## 2023-04-03 ENCOUNTER — Ambulatory Visit: Payer: Medicare HMO | Admitting: Podiatry

## 2023-04-03 DIAGNOSIS — M79674 Pain in right toe(s): Secondary | ICD-10-CM

## 2023-04-03 DIAGNOSIS — M79675 Pain in left toe(s): Secondary | ICD-10-CM

## 2023-04-03 DIAGNOSIS — B351 Tinea unguium: Secondary | ICD-10-CM | POA: Diagnosis not present

## 2023-04-03 DIAGNOSIS — R6889 Other general symptoms and signs: Secondary | ICD-10-CM | POA: Diagnosis not present

## 2023-04-03 NOTE — Progress Notes (Signed)
       Subjective:  Patient ID: Kristen Oliver, female    DOB: 1951-01-30,  MRN: [id number]   Kristen Oliver presents to clinic today for:  Chief Complaint  Patient presents with   Nail Problem    RFC  . Patient notes nails are thick, discolored, elongated and painful in shoegear when trying to ambulate.  She has gotten pedicures in the past but she does not feel comfortable with the stability of the insurance at those locations.  At 1 time she did have lotion applied to her legs there which caused burning.  PCP is Merri Brunette, MD.  Allergies  Allergen Reactions   Tramadol Hcl Other (See Comments)    Did not like the way it makes me feel    Review of Systems: Negative except as noted in the HPI.  Objective:  There were no vitals filed for this visit.  Kristen Oliver is a pleasant 72 y.o. female in NAD. AAO x 3.  Vascular Examination: Capillary refill time is 3-5 seconds to toes bilateral. Palpable pedal pulses b/l LE. Digital hair present b/l. No pedal edema b/l. Skin temperature gradient WNL b/l. No varicosities b/l. No cyanosis or clubbing noted b/l.   Dermatological Examination: Pedal skin with normal turgor, texture and tone b/l. No open wounds. No interdigital macerations b/l. Toenails x10 are 3mm thick, discolored, dystrophic with subungual debris. There is pain with compression of the nail plates.  They are elongated x10  Neurological Examination: Epicritic sensation intact bilateral LE.   Musculoskeletal Examination: Muscle strength 5/5 to all LE muscle groups b/l.   Assessment/Plan: 1. Pain due to onychomycosis of toenails of both feet     The mycotic toenails were sharply debrided x10 with sterile nail nippers and a power debriding burr to decrease bulk/thickness and length.    Patient did have nail polish on which made it difficult to discern the cause of the thickness.  But utilizing the bur did remove some nail polish so that a  better visualization could be achieved.  Recommended formula 7 to the toenails if she would like to treat any fungal involvement.  Return in about 3 months (around 07/04/2023) for RFC.   Clerance Lav, DPM, FACFAS [facility]     [address] [place], [place] [address]                Office [phone]  Fax [phone]

## 2023-06-15 DIAGNOSIS — R6889 Other general symptoms and signs: Secondary | ICD-10-CM | POA: Diagnosis not present

## 2023-06-15 DIAGNOSIS — H1045 Other chronic allergic conjunctivitis: Secondary | ICD-10-CM | POA: Diagnosis not present

## 2023-06-15 DIAGNOSIS — J301 Allergic rhinitis due to pollen: Secondary | ICD-10-CM | POA: Diagnosis not present

## 2023-06-23 DIAGNOSIS — Z23 Encounter for immunization: Secondary | ICD-10-CM | POA: Diagnosis not present

## 2023-07-03 ENCOUNTER — Ambulatory Visit: Payer: 59 | Admitting: Podiatry

## 2023-07-18 ENCOUNTER — Ambulatory Visit: Payer: 59 | Admitting: Podiatry

## 2023-07-20 DIAGNOSIS — R7309 Other abnormal glucose: Secondary | ICD-10-CM | POA: Diagnosis not present

## 2023-07-20 DIAGNOSIS — I10 Essential (primary) hypertension: Secondary | ICD-10-CM | POA: Diagnosis not present

## 2023-07-20 DIAGNOSIS — R6889 Other general symptoms and signs: Secondary | ICD-10-CM | POA: Diagnosis not present

## 2023-07-20 DIAGNOSIS — R350 Frequency of micturition: Secondary | ICD-10-CM | POA: Diagnosis not present

## 2023-07-20 DIAGNOSIS — E559 Vitamin D deficiency, unspecified: Secondary | ICD-10-CM | POA: Diagnosis not present

## 2023-07-27 DIAGNOSIS — R6889 Other general symptoms and signs: Secondary | ICD-10-CM | POA: Diagnosis not present

## 2023-07-27 DIAGNOSIS — E559 Vitamin D deficiency, unspecified: Secondary | ICD-10-CM | POA: Diagnosis not present

## 2023-07-27 DIAGNOSIS — I10 Essential (primary) hypertension: Secondary | ICD-10-CM | POA: Diagnosis not present

## 2023-07-27 DIAGNOSIS — Z0000 Encounter for general adult medical examination without abnormal findings: Secondary | ICD-10-CM | POA: Diagnosis not present

## 2023-07-27 DIAGNOSIS — Z6834 Body mass index (BMI) 34.0-34.9, adult: Secondary | ICD-10-CM | POA: Diagnosis not present

## 2023-07-27 DIAGNOSIS — Z23 Encounter for immunization: Secondary | ICD-10-CM | POA: Diagnosis not present

## 2023-08-17 DIAGNOSIS — R6889 Other general symptoms and signs: Secondary | ICD-10-CM | POA: Diagnosis not present

## 2023-08-21 ENCOUNTER — Encounter: Payer: Self-pay | Admitting: Podiatry

## 2023-08-21 ENCOUNTER — Ambulatory Visit (INDEPENDENT_AMBULATORY_CARE_PROVIDER_SITE_OTHER): Payer: Medicare HMO | Admitting: Podiatry

## 2023-08-21 DIAGNOSIS — I10 Essential (primary) hypertension: Secondary | ICD-10-CM | POA: Insufficient documentation

## 2023-08-21 DIAGNOSIS — M79675 Pain in left toe(s): Secondary | ICD-10-CM

## 2023-08-21 DIAGNOSIS — B351 Tinea unguium: Secondary | ICD-10-CM

## 2023-08-21 DIAGNOSIS — M79674 Pain in right toe(s): Secondary | ICD-10-CM | POA: Diagnosis not present

## 2023-08-21 DIAGNOSIS — R6889 Other general symptoms and signs: Secondary | ICD-10-CM | POA: Diagnosis not present

## 2023-08-21 NOTE — Progress Notes (Signed)
       Subjective:  Patient ID: Kristen Oliver, female    DOB: [birth year],  MRN: [id number]  Kristen Oliver presents to clinic today for:  Chief Complaint  Patient presents with   Debridement    Trim toenails    Patient notes nails are thick, discolored, elongated and painful in shoegear when trying to ambulate.    PCP is Merri Brunette, MD.  Past Medical History:  Diagnosis Date   Allergy    Anemia    Arthritis    knee   Hypertension    Pulmonary nodules     Past Surgical History:  Procedure Laterality Date   COLONOSCOPY  2012   Dr.Mann   COLONOSCOPY  2007   Dr.Orr--polyp removed   KNEE ARTHROSCOPY Bilateral    ROTATOR CUFF REPAIR     VAGINAL HYSTERECTOMY      Allergies  Allergen Reactions   Tramadol Hcl Other (See Comments)    Did not like the way it makes me feel    Review of Systems: Negative except as noted in the HPI.  Objective:  Kristen Oliver is a pleasant 72 y.o. female in NAD. AAO x 3.  Vascular Examination: Capillary refill time is 3-5 seconds to toes bilateral. Palpable pedal pulses b/l LE. Digital hair present b/l.  Skin temperature gradient WNL b/l. No varicosities b/l. No cyanosis noted b/l.   Dermatological Examination: Pedal skin with normal turgor, texture and tone b/l. No open wounds. No interdigital macerations b/l. Toenails x10 are 3mm thick, discolored, dystrophic with subungual debris. There is pain with compression of the nail plates.  They are elongated x10  Assessment/Plan: 1. Pain due to onychomycosis of toenails of both feet    The mycotic toenails were sharply debrided x10 with sterile nail nippers and a power debriding burr to decrease bulk/thickness and length.    Return in about 3 months (around 11/19/2023) for RFC.   Clerance Lav, DPM, FACFAS [facility]     [address] [place], [place] [address]                Office [phone]  Fax  [phone]

## 2023-10-13 DIAGNOSIS — J309 Allergic rhinitis, unspecified: Secondary | ICD-10-CM | POA: Diagnosis not present

## 2023-11-20 ENCOUNTER — Ambulatory Visit: Payer: 59 | Admitting: Podiatry

## 2023-12-25 ENCOUNTER — Ambulatory Visit: Admitting: Podiatry

## 2024-01-17 DIAGNOSIS — E559 Vitamin D deficiency, unspecified: Secondary | ICD-10-CM | POA: Diagnosis not present

## 2024-01-17 DIAGNOSIS — Z0000 Encounter for general adult medical examination without abnormal findings: Secondary | ICD-10-CM | POA: Diagnosis not present

## 2024-01-17 DIAGNOSIS — I10 Essential (primary) hypertension: Secondary | ICD-10-CM | POA: Diagnosis not present

## 2024-01-17 DIAGNOSIS — R7309 Other abnormal glucose: Secondary | ICD-10-CM | POA: Diagnosis not present

## 2024-01-24 ENCOUNTER — Other Ambulatory Visit (HOSPITAL_BASED_OUTPATIENT_CLINIC_OR_DEPARTMENT_OTHER): Payer: Self-pay | Admitting: Registered Nurse

## 2024-01-24 DIAGNOSIS — I10 Essential (primary) hypertension: Secondary | ICD-10-CM | POA: Diagnosis not present

## 2024-01-24 DIAGNOSIS — E559 Vitamin D deficiency, unspecified: Secondary | ICD-10-CM | POA: Diagnosis not present

## 2024-01-24 DIAGNOSIS — E7800 Pure hypercholesterolemia, unspecified: Secondary | ICD-10-CM

## 2024-01-25 ENCOUNTER — Other Ambulatory Visit: Payer: Self-pay | Admitting: Internal Medicine

## 2024-01-25 DIAGNOSIS — Z1231 Encounter for screening mammogram for malignant neoplasm of breast: Secondary | ICD-10-CM

## 2024-02-15 ENCOUNTER — Ambulatory Visit
Admission: RE | Admit: 2024-02-15 | Discharge: 2024-02-15 | Disposition: A | Discharge status: Home / Self Care | Source: Ambulatory Visit | Attending: Internal Medicine | Admitting: Internal Medicine

## 2024-02-15 DIAGNOSIS — Z1231 Encounter for screening mammogram for malignant neoplasm of breast: Secondary | ICD-10-CM | POA: Diagnosis not present

## 2024-03-04 DIAGNOSIS — I10 Essential (primary) hypertension: Secondary | ICD-10-CM | POA: Diagnosis not present

## 2024-03-04 DIAGNOSIS — R42 Dizziness and giddiness: Secondary | ICD-10-CM | POA: Diagnosis not present

## 2024-06-06 ENCOUNTER — Institutional Professional Consult (permissible substitution) (INDEPENDENT_AMBULATORY_CARE_PROVIDER_SITE_OTHER): Admitting: Otolaryngology

## 2024-08-15 ENCOUNTER — Ambulatory Visit (INDEPENDENT_AMBULATORY_CARE_PROVIDER_SITE_OTHER): Admitting: Otolaryngology

## 2024-08-15 ENCOUNTER — Encounter (INDEPENDENT_AMBULATORY_CARE_PROVIDER_SITE_OTHER): Payer: Self-pay | Admitting: Otolaryngology

## 2024-08-15 VITALS — BP 126/80 | HR 90 | Ht 62.0 in

## 2024-08-15 DIAGNOSIS — H8110 Benign paroxysmal vertigo, unspecified ear: Secondary | ICD-10-CM

## 2024-08-15 DIAGNOSIS — R42 Dizziness and giddiness: Secondary | ICD-10-CM

## 2024-08-15 NOTE — Progress Notes (Signed)
 Dear Dr. Prevost, Here is my assessment for our mutual patient, Kristen Oliver. Thank you for allowing me the opportunity to care for your patient. Please do not hesitate to contact me should you have any other questions. Sincerely, Dr. Eldora Blanch  Otolaryngology Clinic Note Referring provider: Dr. Royden HPI:  Kristen Oliver is a 73 y.o. female kindly referred by Dr. Prevost for evaluation of vertigo  Initial visit (08/2024): Discussed the use of AI scribe software for clinical note transcription with the patient, who gave verbal consent to proceed.  History of Present Illness Kristen Oliver is a 73 year old female who presents with night time vertigo.  She has intermittent night time vertigo described as bed spinning, triggered by turning over in bed (only on one side, cannot remember which) or getting up to go to the bathroom. Episodes occur about twice a week, last about a minute, and have decreased in severity since they began this summer. Tried meclizine, felt like it helped some. No meniere's type symptoms. No antecedent event, ongoing since summer.  She has no dizziness/vertigo/imbalance during the day and no associated nausea, vomiting, headache, or symptoms when sitting or standing or changing positions. No palpitations. She denies ear pain, aural fullness, drainage, tinnitus, hearing loss, and photophobia or phonophobia.  No prior hearing test  Patient denies: ear pain, fullness, drainage, tinnitus, hearing change Patient additionally denies: deep pain in ear canal, eustachian tube symptoms such as popping, crackling, sensitive to pressure changes Patient also denies barotrauma, vestibular suppressant use, ototoxic medication use Prior ear surgery: no   ENT Surgery: no Personal or FHx of bleeding dz or anesthesia difficulty: no  AP/AC: no  Tobacco: no.  PMHx: HTN, Pulmonary Nodule, Anemia, GERD, OA, MDD/GAD, Migraines  Independent Review of  Additional Tests or Records:  Kristen Oliver (03/04/2024) Referral notes reviewed and uploaded or available in chart in media tab - patient feels dizzy when lying down, feels like she is going to pass out - only when lying flat; stands up and it stops; Dx: Vertigo, Rx: Meclizine, ref to [facility]NT [facility] (01/24/2024): BUN/Cr 16/0.99 CBC 12/26/2023: WBC 9.0 PMH/Meds/All/SocHx/FamHx/ROS:   Past Medical History:  Diagnosis Date   Allergy    Anemia    Arthritis    knee   Hypertension    Pulmonary nodules      Past Surgical History:  Procedure Laterality Date   BREAST BIOPSY Left    COLONOSCOPY  2012   Dr.Mann   COLONOSCOPY  2007   Dr.Orr--polyp removed   KNEE ARTHROSCOPY Bilateral    ROTATOR CUFF REPAIR     VAGINAL HYSTERECTOMY      Family History  Problem Relation Age of Onset   Breast cancer Daughter    Colon cancer Neg Hx    Colon polyps Neg Hx    Esophageal cancer Neg Hx    Rectal cancer Neg Hx    Stomach cancer Neg Hx      Social Connections: Not on file      Current Outpatient Medications:    acetaminophen  (TYLENOL ) 500 MG tablet, Take 500 mg by mouth every 6 (six) hours as needed., Disp: , Rfl:    fexofenadine (ALLEGRA) 60 MG tablet, , Disp: , Rfl:    Ibuprofen  (ADVIL  PO), Take by mouth as needed., Disp: , Rfl:    ipratropium (ATROVENT) 0.06 % nasal spray, 2 drops in each nostril as needed, Disp: , Rfl:    loratadine (CLARITIN) 10 MG tablet, 1 tablet, Disp: , Rfl:  LORazepam (ATIVAN) 0.5 MG tablet, Take 0.5 mg by mouth every 8 (eight) hours as needed. For anxiety, Disp: , Rfl:    losartan-hydrochlorothiazide (HYZAAR) 100-25 MG per tablet, Take 1 tablet by mouth daily., Disp: , Rfl:    potassium chloride SA (KLOR-CON) 20 MEQ tablet, 1 tablet, Disp: , Rfl:    rizatriptan (MAXALT-MLT) 10 MG disintegrating tablet, Take 10 mg by mouth as needed. For migraines. May repeat in 2 hours if needed, Disp: , Rfl:    Vitamin D, Ergocalciferol, (DRISDOL) 50000 UNITS CAPS, Take 50,000 Units  by mouth every 7 (seven) days. Monday, Disp: , Rfl:    Physical Exam:   BP 126/80 (BP Location: Left Arm, Patient Position: Sitting, Cuff Size: Large)   Pulse 90   Ht 5' 2 (1.575 m)   SpO2 94%   BMI 31.09 kg/m   Salient findings:  CN II-XII intact  Bilateral EAC clear and TM intact with well pneumatized middle ear spaces Weber 512: mid Rinne 512: AC > BC b/l  Head shake neg DH neg today; gait normal Anterior rhinoscopy: Septum intact; bilateral inferior turbinates without significant hypertrophy No lesions of oral cavity/oropharynx No obviously palpable neck masses/lymphadenopathy/thyromegaly No respiratory distress or stridor  Seprately Identifiable Procedures:  Prior to initiating any procedures, risks/benefits/alternatives were explained to the patient and verbal consent obtained. None  Impression & Plans:  Kristen Oliver is a 73 y.o. female with:  1. Vertigo   2. BPPV (benign paroxysmal positional vertigo), unspecified laterality   3. Dizziness    Based on her sx and history, does not appear to be vestibular neuritis or meniere's but rather BPPV. Does not seem orthostatic in nature because only when turning bed. DH neg today but will send for formal vestibular rehab and will get baseline audio as well. Stop meclizine --- take only as needed now F/u in 4 months, sooner as necessary. She is in agreement with this plan   Thank you for allowing me the opportunity to care for your patient. Please do not hesitate to contact me should you have any other questions.  Sincerely, Eldora Blanch, MD Otolaryngologist (ENT), [facility] Phone: [phone] Fax: [phone]  08/15/2024, 2:33 PM   MDM:  Level 4 Complexity/Problems addressed: low Data complexity: mod - independent review of note, lab, ordering test - Morbidity: mod  - Prescription Drug prescribed or managed: y -- discontinue meclizine

## 2024-09-24 ENCOUNTER — Ambulatory Visit: Admitting: Physical Therapy

## 2024-10-02 ENCOUNTER — Encounter (INDEPENDENT_AMBULATORY_CARE_PROVIDER_SITE_OTHER): Payer: Self-pay | Admitting: Otolaryngology

## 2024-10-02 ENCOUNTER — Ambulatory Visit (INDEPENDENT_AMBULATORY_CARE_PROVIDER_SITE_OTHER): Admitting: Audiology

## 2024-10-02 ENCOUNTER — Ambulatory Visit (INDEPENDENT_AMBULATORY_CARE_PROVIDER_SITE_OTHER): Admitting: Otolaryngology

## 2024-10-02 VITALS — BP 122/81 | HR 93 | Ht 62.0 in | Wt 179.0 lb

## 2024-10-02 DIAGNOSIS — H903 Sensorineural hearing loss, bilateral: Secondary | ICD-10-CM | POA: Diagnosis not present

## 2024-10-02 DIAGNOSIS — H8110 Benign paroxysmal vertigo, unspecified ear: Secondary | ICD-10-CM

## 2024-10-02 NOTE — Progress Notes (Signed)
 Dear Dr. Clarice, Here is my assessment for our mutual patient, Kristen Oliver. Thank you for allowing me the opportunity to care for your patient. Please do not hesitate to contact me should you have any other questions. Sincerely, Dr. Eldora Blanch  Otolaryngology Clinic Note Referring provider: Dr. Clarice HPI:  Kristen Oliver is a 74 y.o. female kindly referred by Dr. Clarice for evaluation of vertigo  Initial visit (08/2024): Discussed the use of AI scribe software for clinical note transcription with the patient, who gave verbal consent to proceed.  History of Present Illness Kristen Oliver is a 74 year old female who presents with night time vertigo.  She has intermittent night time vertigo described as bed spinning, triggered by turning over in bed (only on one side, cannot remember which) or getting up to go to the bathroom. Episodes occur about twice a week, last about a minute, and have decreased in severity since they began this summer. Tried meclizine, felt like it helped some. No meniere's type symptoms. No antecedent event, ongoing since summer.  She has no dizziness/vertigo/imbalance during the day and no associated nausea, vomiting, headache, or symptoms when sitting or standing or changing positions. No palpitations. She denies ear pain, aural fullness, drainage, tinnitus, hearing loss, and photophobia or phonophobia.  No prior hearing test  Patient denies: ear pain, fullness, drainage, tinnitus, hearing change Patient additionally denies: deep pain in ear canal, eustachian tube symptoms such as popping, crackling, sensitive to pressure changes Patient also denies barotrauma, vestibular suppressant use, ototoxic medication use Prior ear surgery: no  --------------------------------------------------------- 10/02/2024 Returns with audio; no issues from hearing standpoint subjectively; she has not had any more episodes of vertigo for past 2 months and is  doing very well from ear and balance standpoint    ENT Surgery: no Personal or FHx of bleeding dz or anesthesia difficulty: no  AP/AC: no  Tobacco: no.  PMHx: HTN, Pulmonary Nodule, Anemia, GERD, OA, MDD/GAD, Migraines  Independent Review of Additional Tests or Records:  Kristen Oliver (03/04/2024) Referral notes reviewed and uploaded or available in chart in media tab - patient feels dizzy when lying down, feels like she is going to pass out - only when lying flat; stands up and it stops; Dx: Vertigo, Rx: Meclizine, ref to [facility]NT [facility] (01/24/2024): BUN/Cr 16/0.99 CBC 12/26/2023: WBC 9.0 09/2024 Audiogram was independently reviewed and interpreted by me and it reveals - normal downsloping to mod SNHL; WRT 96% AU at 60dB; A/A tymps   SNHL= Sensorineural hearing loss  PMH/Meds/All/SocHx/FamHx/ROS:   Past Medical History:  Diagnosis Date   Allergy    Anemia    Arthritis    knee   Hypertension    Pulmonary nodules      Past Surgical History:  Procedure Laterality Date   BREAST BIOPSY Left    COLONOSCOPY  2012   Dr.Mann   COLONOSCOPY  2007   Dr.Orr--polyp removed   KNEE ARTHROSCOPY Bilateral    ROTATOR CUFF REPAIR     VAGINAL HYSTERECTOMY      Family History  Problem Relation Age of Onset   Breast cancer Daughter    Colon cancer Neg Hx    Colon polyps Neg Hx    Esophageal cancer Neg Hx    Rectal cancer Neg Hx    Stomach cancer Neg Hx      Social Connections: Not on file      Current Outpatient Medications:    acetaminophen  (TYLENOL ) 500 MG tablet, Take 500 mg by mouth  every 6 (six) hours as needed., Disp: , Rfl:    azelastine (OPTIVAR) 0.05 % ophthalmic solution, 1 drop into affected eye Ophthalmic Twice a day; Duration: 30 days, Disp: , Rfl:    docusate sodium (COLACE) 100 MG capsule, take 2 caps orally PO QHS, Disp: , Rfl:    fexofenadine (ALLEGRA) 60 MG tablet, , Disp: , Rfl:    Ibuprofen  (ADVIL  PO), Take by mouth as needed., Disp: , Rfl:    ipratropium  (ATROVENT) 0.06 % nasal spray, 2 drops in each nostril as needed, Disp: , Rfl:    loratadine (CLARITIN) 10 MG tablet, 1 tablet, Disp: , Rfl:    LORazepam (ATIVAN) 0.5 MG tablet, Take 0.5 mg by mouth every 8 (eight) hours as needed. For anxiety, Disp: , Rfl:    losartan-hydrochlorothiazide (HYZAAR) 100-25 MG per tablet, Take 1 tablet by mouth daily., Disp: , Rfl:    potassium chloride SA (KLOR-CON) 20 MEQ tablet, 1 tablet, Disp: , Rfl:    rizatriptan (MAXALT-MLT) 10 MG disintegrating tablet, Take 10 mg by mouth as needed. For migraines. May repeat in 2 hours if needed, Disp: , Rfl:    Vitamin D, Ergocalciferol, (DRISDOL) 50000 UNITS CAPS, Take 50,000 Units by mouth every 7 (seven) days. Monday, Disp: , Rfl:    Physical Exam:   BP 122/81 (BP Location: Right Leg, Patient Position: Sitting, Cuff Size: Large)   Pulse 93   Ht 5' 2 (1.575 m)   Wt 179 lb (81.2 kg)   SpO2 95%   BMI 32.74 kg/m   Salient findings:  CN II-XII intact  Bilateral EAC clear and TM intact with well pneumatized middle ear spaces Weber 512: mid Rinne 512: AC > BC b/l  Head shake neg DH neg today; gait normal No respiratory distress or stridor  Seprately Identifiable Procedures:  Prior to initiating any procedures, risks/benefits/alternatives were explained to the patient and verbal consent obtained. None  Impression & Plans:  Kristen Oliver is a 74 y.o. female with:  1. Sensorineural hearing loss, bilateral   2. BPPV (benign paroxysmal positional vertigo), unspecified laterality    Based on sx, most likely BPPV; resolved and she is doing well from this standpoint; SNHL: long-standing, likely presbycusis; d/w pt re: amplification but she decl given doing well subjectively  D/w pt f/u, opted PRN, certainly if sx recover    Thank you for allowing me the opportunity to care for your patient. Please do not hesitate to contact me should you have any other questions.  Sincerely, Eldora Blanch,  MD Otolaryngologist (ENT), [facility] Phone: [phone] Fax: [phone]  10/02/2024, 9:48 AM   MDM:  Level 3 Complexity/Problems addressed: low - chronic problem, stable Data complexity: low - review of test - Morbidity: low - Prescription Drug prescribed or managed:n

## 2024-10-02 NOTE — Progress Notes (Signed)
" °  [address] [place], [place] [address] [phone]  Audiological Evaluation    Name: Kristen Oliver     DOB:   [birth year]      MRN:   [id number]                                                                                     Service Date: 10/02/2024     Accompanied by: self    Patient comes today after Dr. Tobie, ENT sent a referral for a hearing evaluation due to concerns with dizziness.   Symptoms Yes Details  Hearing loss  []  Not perceived  Tinnitus  [x]  At night, left ear - comes and goes  Ear pain/ infections/pressure  []    Balance problems  [x]  Vertigo once  ( about 2 months ago) while she was sleeping on her side. Reports has not had it again and her physician recommended medications.  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 Left ear:  Clear external ear canal and notable landmarks visualized on the tympanic membrane.  Tympanometry: Right ear: Type A - Normal external ear canal volume with normal middle ear pressure and normal tympanic membrane compliance. Findings are consistent with normal middle ear function. Left ear: Type A - Normal external ear canal volume with normal middle ear pressure and normal tympanic membrane compliance. Findings are consistent with normal middle ear function.   Hearing Evaluation The hearing test results were completed under headphones and results are deemed to be of good reliability. Test technique:  conventional    Pure tone Audiometry: Both ears-  Normal hearing from [phone] Hz and than mild sloping to moderate  sensorineural hearing loss from 2000 Hz - 8000 Hz.    Speech Audiometry: Right ear- Speech Reception Threshold (SRT) was obtained at 20 dBHL. Left ear-Speech Reception Threshold (SRT)  was obtained at 15 dBHL.   Word Recognition Score Tested using NU-6 (recorded) Right ear: 96% was obtained at a presentation level of 60 dBHL with contralateral masking which is deemed as  excellent. Left ear: 96% was obtained at a presentation level of 60 dBHL with contralateral masking which is deemed as  excellent.   Impression: There is not a significant difference in pure-tone thresholds between ears. There is not a significant difference in the word recognition score in between ears.    Recommendations: Follow up with ENT as scheduled. Return for a hearing evaluation in 102 years, before if concerns with hearing changes arise or per MD recommendation. Consider various tinnitus strategies, including the use of a sound generator, hearing aids, and/or tinnitus retraining therapy.  Consider a communication needs assessment for amplification after medical clearance is obtained, if needed, and when patient is ready.   Vaughan Garfinkle MARIE LEROUX-MARTINEZ, AUD  "

## 2024-10-15 ENCOUNTER — Ambulatory Visit: Admitting: Physical Therapy
